# Patient Record
Sex: Male | Born: 1965
Health system: Southern US, Community
[De-identification: ages and names within clinical notes are randomized; demographics above are authoritative.]

## PROBLEM LIST (undated history)

## (undated) ENCOUNTER — Emergency Department (HOSPITAL_COMMUNITY): Discharge status: Absent without leave | Payer: Self-pay

## (undated) DIAGNOSIS — N529 Male erectile dysfunction, unspecified: Secondary | ICD-10-CM

## (undated) DIAGNOSIS — E7800 Pure hypercholesterolemia, unspecified: Secondary | ICD-10-CM

## (undated) DIAGNOSIS — I10 Essential (primary) hypertension: Secondary | ICD-10-CM

## (undated) HISTORY — DX: Male erectile dysfunction, unspecified: N52.9

## (undated) HISTORY — DX: Essential (primary) hypertension: I10

## (undated) HISTORY — DX: Pure hypercholesterolemia, unspecified: E78.00

## (undated) HISTORY — PX: ELBOW SURGERY: SHX618

---

## 2001-04-03 ENCOUNTER — Ambulatory Visit (HOSPITAL_COMMUNITY): Admission: RE | Admit: 2001-04-03 | Discharge: 2001-04-03 | Payer: Self-pay | Admitting: Internal Medicine

## 2001-04-03 ENCOUNTER — Encounter: Payer: Self-pay | Admitting: Internal Medicine

## 2001-05-14 ENCOUNTER — Ambulatory Visit (HOSPITAL_COMMUNITY): Admission: RE | Admit: 2001-05-14 | Discharge: 2001-05-14 | Payer: Self-pay | Admitting: Internal Medicine

## 2001-05-14 ENCOUNTER — Encounter: Payer: Self-pay | Admitting: Internal Medicine

## 2003-08-25 ENCOUNTER — Other Ambulatory Visit: Admission: RE | Admit: 2003-08-25 | Discharge: 2003-08-25 | Payer: Self-pay | Admitting: Dermatology

## 2004-07-05 ENCOUNTER — Emergency Department (HOSPITAL_COMMUNITY): Admission: EM | Admit: 2004-07-05 | Discharge: 2004-07-05 | Payer: Self-pay | Admitting: Emergency Medicine

## 2013-01-01 ENCOUNTER — Ambulatory Visit (HOSPITAL_COMMUNITY)
Admission: RE | Admit: 2013-01-01 | Discharge: 2013-01-01 | Disposition: A | Discharge status: Home / Self Care | Payer: BC Managed Care – PPO | Source: Ambulatory Visit | Attending: Family Medicine | Admitting: Family Medicine

## 2013-01-01 ENCOUNTER — Other Ambulatory Visit: Payer: Self-pay | Admitting: Family Medicine

## 2013-01-01 DIAGNOSIS — M79642 Pain in left hand: Secondary | ICD-10-CM

## 2013-01-01 DIAGNOSIS — M25549 Pain in joints of unspecified hand: Secondary | ICD-10-CM | POA: Insufficient documentation

## 2013-04-05 ENCOUNTER — Encounter: Payer: Self-pay | Admitting: Family Medicine

## 2013-04-05 ENCOUNTER — Ambulatory Visit (INDEPENDENT_AMBULATORY_CARE_PROVIDER_SITE_OTHER): Payer: BC Managed Care – PPO | Admitting: Family Medicine

## 2013-04-05 VITALS — BP 134/92 | Temp 98.8°F | Ht 70.0 in | Wt 192.2 lb

## 2013-04-05 DIAGNOSIS — W57XXXA Bitten or stung by nonvenomous insect and other nonvenomous arthropods, initial encounter: Secondary | ICD-10-CM

## 2013-04-05 DIAGNOSIS — T148 Other injury of unspecified body region: Secondary | ICD-10-CM

## 2013-04-05 DIAGNOSIS — Z008 Encounter for other general examination: Secondary | ICD-10-CM

## 2013-04-05 MED ORDER — DOXYCYCLINE HYCLATE 100 MG PO CAPS: 100.0000 mg | ORAL_CAPSULE | Freq: Two times a day (BID) | ORAL | Status: DC

## 2013-04-05 NOTE — Progress Notes (Signed)
  Subjective:    Patient ID: Rae Halsted, male    DOB: 09/05/1966, 47 y.o.   MRN: [id number]  HPI This patient had a tick bite several weeks ago and then [place]. He denies any rash denies high fever chills denies abdominal pain but he does relate that he had myalgias felt weak also sweaty felt nauseous no diarrhea no vomiting. No runny nose no bleeding problems.he relates his symptoms as pretty bad Saturday and Sunday with headache sweatiness feeling weak all over feeling a little bit better currently Past medical history benign Family history hypertension Patient does not smoke   Review of Systemssee above.     Objective:   Physical Exam TMs NL throat normal neck is supple lungs are clear heart is regular no rashes seen abdomen is soft       Assessment & Plan:  ppossible tick related illness-doxycycline under milligrams twice a day for the next 10 days in addition to this if high fevers severe rash increased headache vomiting or other problems occur immediately call back for recheck.

## 2013-04-10 LAB — LIPID PANEL
Cholesterol: 299 mg/dL — ABNORMAL HIGH (ref 0–200)
HDL: 45 mg/dL (ref >39)
Total CHOL/HDL Ratio: 6.6 Ratio
Triglycerides: 426 mg/dL — ABNORMAL HIGH (ref <150)

## 2013-04-10 LAB — GLUCOSE, RANDOM: Glucose, Bld: 93 mg/dL (ref 70–99)

## 2013-04-11 ENCOUNTER — Encounter: Payer: Self-pay | Admitting: Family Medicine

## 2013-04-13 ENCOUNTER — Encounter: Payer: Self-pay | Admitting: Family Medicine

## 2013-04-29 ENCOUNTER — Encounter: Payer: Self-pay | Admitting: *Deleted

## 2013-05-07 ENCOUNTER — Encounter: Payer: Self-pay | Admitting: Family Medicine

## 2013-05-07 ENCOUNTER — Ambulatory Visit (INDEPENDENT_AMBULATORY_CARE_PROVIDER_SITE_OTHER): Payer: BC Managed Care – PPO | Admitting: Family Medicine

## 2013-05-07 VITALS — BP 146/104 | Temp 98.7°F | Wt 189.2 lb

## 2013-05-07 DIAGNOSIS — I10 Essential (primary) hypertension: Secondary | ICD-10-CM

## 2013-05-07 DIAGNOSIS — R05 Cough: Secondary | ICD-10-CM

## 2013-05-07 DIAGNOSIS — E781 Pure hyperglyceridemia: Secondary | ICD-10-CM

## 2013-05-07 DIAGNOSIS — R059 Cough, unspecified: Secondary | ICD-10-CM

## 2013-05-07 DIAGNOSIS — E785 Hyperlipidemia, unspecified: Secondary | ICD-10-CM | POA: Insufficient documentation

## 2013-05-07 MED ORDER — AZITHROMYCIN 250 MG PO TABS: ORAL_TABLET | ORAL | Status: DC

## 2013-05-07 NOTE — Patient Instructions (Signed)
  Fish oil or flax seed twice a day Labs in three months Office visit in 3 months    DASH Diet The DASH diet stands for "Dietary Approaches to Stop Hypertension." It is a healthy eating plan that has been shown to reduce high blood pressure (hypertension) in as little as 14 days, while also possibly providing other significant health benefits. These other health benefits include reducing the risk of breast cancer after menopause and reducing the risk of type 2 diabetes, heart disease, colon cancer, and stroke. Health benefits also include weight loss and slowing kidney failure in patients with chronic kidney disease.  DIET GUIDELINES  Limit salt (sodium). Your diet should contain less than 1500 mg of sodium daily.  Limit refined or processed carbohydrates. Your diet should include mostly whole grains. Desserts and added sugars should be used sparingly.  Include small amounts of heart-healthy fats. These types of fats include nuts, oils, and tub margarine. Limit saturated and trans fats. These fats have been shown to be harmful in the body. CHOOSING FOODS  The following food groups are based on a 2000 calorie diet. See your Registered Dietitian for individual calorie needs. Grains and Grain Products (6 to 8 servings daily)  Eat More Often: Whole-wheat bread, brown rice, whole-grain or wheat pasta, quinoa, popcorn without added fat or salt (air popped).  Eat Less Often: White bread, white pasta, white rice, cornbread. Vegetables (4 to 5 servings daily)  Eat More Often: Fresh, frozen, and canned vegetables. Vegetables may be raw, steamed, roasted, or grilled with a minimal amount of fat.  Eat Less Often/Avoid: Creamed or fried vegetables. Vegetables in a cheese sauce. Fruit (4 to 5 servings daily)  Eat More Often: All fresh, canned (in natural juice), or frozen fruits. Dried fruits without added sugar. One hundred percent fruit juice ( cup [237 mL] daily).  Eat Less Often: Dried fruits  with added sugar. Canned fruit in light or heavy syrup. [employer], Fish, and Poultry (2 servings or less daily. One serving is 3 to 4 oz [85-114 g]).  Eat More Often: Ninety percent or leaner ground beef, tenderloin, sirloin. Round cuts of beef, chicken breast, [place] breast. All fish. Grill, bake, or broil your meat. Nothing should be fried.  Eat Less Often/Avoid: Fatty cuts of meat, [place], or chicken leg, thigh, or wing. Fried cuts of meat or fish. Dairy (2 to 3 servings)  Eat More Often: Low-fat or fat-free milk, low-fat plain or light yogurt, reduced-fat or part-skim cheese.  Eat Less Often/Avoid: Milk (whole, 2%).Whole milk yogurt. Full-fat cheeses. Nuts, Seeds, and Legumes (4 to 5 servings per week)  Eat More Often: All without added salt.  Eat Less Often/Avoid: Salted nuts and seeds, canned beans with added salt. Fats and Sweets (limited)  Eat More Often: Vegetable oils, tub margarines without trans fats, sugar-free gelatin. Mayonnaise and salad dressings.  Eat Less Often/Avoid: Coconut oils, palm oils, butter, stick margarine, cream, half and half, cookies, candy, pie. FOR MORE INFORMATION The Dash Diet Eating Plan: www.dashdiet.org Document Released: 11/07/2011 Document Revised: 02/10/2012 Document Reviewed: 11/07/2011 [facility] Patient Information 2014 [place], [place].

## 2013-05-07 NOTE — Progress Notes (Signed)
  Subjective:    Patient ID: Scott Villarreal, male    DOB: 03/07/1966, 47 y.o.   MRN: [id number]  Cough This is a new problem. The current episode started in the past 7 days. The problem has been gradually worsening. The cough is productive of sputum. Associated symptoms include ear pain and a sore throat. He has tried OTC cough suppressant for the symptoms. The treatment provided mild relief.      Review of Systems  HENT: Positive for ear pain and sore throat.   Respiratory: Positive for cough.        Objective:   Physical Exam Lungs are clear hearts regular pulse normal blood pressure is moderately elevated. Head congestion noted mild sinus tenderness eardrums normal throat normal.       Assessment & Plan:  Hypertension-patient encouraged to watch diet, check blood pressure with a home monitor, bring home monitor in, followup again within 3 months time check lab work at that time. If blood pressure still up will need additional him treatments  Elevated triglycerides-patient does not abuse alcohol but he does need to reduce the amount of alcohol he is using. Minimize starches sugars. Recheck lipid profile in approximately 3 months. Flaxseed or fish oil capsules recommended.  Acute sinusitis-Zithromax as directed. Allergy tablet as needed. Followup if problems.

## 2013-05-21 ENCOUNTER — Ambulatory Visit (INDEPENDENT_AMBULATORY_CARE_PROVIDER_SITE_OTHER): Payer: BC Managed Care – PPO | Admitting: Nurse Practitioner

## 2013-05-21 ENCOUNTER — Encounter: Payer: Self-pay | Admitting: Nurse Practitioner

## 2013-05-21 VITALS — BP 138/86 | Temp 98.1°F | Wt 193.0 lb

## 2013-05-21 DIAGNOSIS — J320 Chronic maxillary sinusitis: Secondary | ICD-10-CM

## 2013-05-21 MED ORDER — AMOXICILLIN-POT CLAVULANATE 875-125 MG PO TABS: 1.0000 | ORAL_TABLET | Freq: Two times a day (BID) | ORAL | Status: DC

## 2013-05-21 MED ORDER — METHYLPREDNISOLONE ACETATE 40 MG/ML IJ SUSP
40.0000 mg | Freq: Once | INTRAMUSCULAR | Status: AC
  Administered 2013-05-21: 40 mg via INTRAMUSCULAR

## 2013-05-21 NOTE — Patient Instructions (Signed)
Antihistamine such as Claritin, Zyrtec or Allegra Nasacort AQ as directed

## 2013-05-24 NOTE — Progress Notes (Signed)
Subjective:  Presents with complaints of pain in the right anterior cervical area that began 3 days ago. No fever. Feeling of fluid in his right ear. Rare cough. No sore throat. Head congestion. Maxillary area sinus pressure more so on the right side. Occasional headache. No ear pain. No wheezing. Just completed a Z-Pak, pressure continues.  Objective:   BP 138/86  Temp(Src) 98.1 F (36.7 C) (Oral)  Wt 193 lb (87.544 kg)  BMI 27.69 kg/m2 NAD. Alert, oriented. TMs significant clear effusion, no erythema. Pharynx injected with PND noted. Neck supple with mild soft adenopathy, tender in the right anterior cervical area. Lungs clear. Heart regular rate rhythm.  Assessment:Maxillary sinusitis - Plan: methylPREDNISolone acetate (DEPO-MEDROL) injection 40 mg  Plan: Meds ordered this encounter  Medications  . DISCONTD: doxycycline (VIBRA-TABS) 100 MG tablet    Sig:   . amoxicillin-clavulanate (AUGMENTIN) 875-125 MG per tablet    Sig: Take 1 tablet by mouth 2 (two) times daily.    Dispense:  20 tablet    Refill:  0    Order Specific Question:  Supervising Provider    Answer:  Merlyn Albert [[id number]]  . methylPREDNISolone acetate (DEPO-MEDROL) injection 40 mg    Sig:    OTC meds as directed including antihistamines and Nasacort AQ. Call back in 7-10 days if no improvement, sooner if worse.

## 2013-08-17 ENCOUNTER — Ambulatory Visit (INDEPENDENT_AMBULATORY_CARE_PROVIDER_SITE_OTHER): Payer: BC Managed Care – PPO | Admitting: Family Medicine

## 2013-08-17 ENCOUNTER — Encounter: Payer: Self-pay | Admitting: Family Medicine

## 2013-08-17 VITALS — BP 164/114 | Ht 69.0 in | Wt 187.0 lb

## 2013-08-17 DIAGNOSIS — I10 Essential (primary) hypertension: Secondary | ICD-10-CM

## 2013-08-17 DIAGNOSIS — M25519 Pain in unspecified shoulder: Secondary | ICD-10-CM

## 2013-08-17 DIAGNOSIS — M25511 Pain in right shoulder: Secondary | ICD-10-CM

## 2013-08-17 MED ORDER — POTASSIUM CHLORIDE ER 10 MEQ PO TBCR: 10.0000 meq | EXTENDED_RELEASE_TABLET | Freq: Every day | ORAL | Status: DC

## 2013-08-17 MED ORDER — HYDROCHLOROTHIAZIDE 25 MG PO TABS: 25.0000 mg | ORAL_TABLET | Freq: Every day | ORAL | Status: DC

## 2013-08-17 MED ORDER — MELOXICAM 15 MG PO TABS: 15.0000 mg | ORAL_TABLET | Freq: Every day | ORAL | Status: DC

## 2013-08-17 NOTE — Progress Notes (Signed)
  Subjective:    Patient ID: Scott Villarreal, male    DOB: November 05, 1966, 47 y.o.   MRN: [id number]  HPIHere for right shoulder pain for about 1 month. Right arm goes numb sometimes. Tried aleve. It helps some.  Lucendia Herrlich states pain hurts with certain movements tried the leads and ibuprofen without help states pain radiates down the right arm. Hurts with certain movements. Does not start in the neck it is more in the shoulder and also hurts when he sleeps on it. No known injury no prior trouble with this.  Has family history hypertension. Review of Systems Patient denies chest tightness headache shortness of breath.    Objective:   Physical Exam  Blood pressure elevated even on recheck best reading 148/94 lungs clear heart regular pulse normal moderate shoulder tenderness fair range of motion no obvious on rotator cuff more likely a.c. joint      Assessment & Plan:  Shoulder pain-Mobic, referral to orthopedics may need injections possibly x-ray HTN start hydrochlorothiazide one each morning along with potassium followup in 3 months recheck blood pressure.

## 2013-10-07 ENCOUNTER — Other Ambulatory Visit: Payer: Self-pay

## 2014-03-01 ENCOUNTER — Encounter: Payer: Self-pay | Admitting: Family Medicine

## 2014-03-01 ENCOUNTER — Ambulatory Visit (INDEPENDENT_AMBULATORY_CARE_PROVIDER_SITE_OTHER): Payer: BC Managed Care – PPO | Admitting: Family Medicine

## 2014-03-01 VITALS — BP 132/98 | Temp 98.3°F | Ht 70.0 in | Wt 194.0 lb

## 2014-03-01 DIAGNOSIS — J329 Chronic sinusitis, unspecified: Secondary | ICD-10-CM

## 2014-03-01 MED ORDER — AMOXICILLIN-POT CLAVULANATE 875-125 MG PO TABS: 1.0000 | ORAL_TABLET | Freq: Two times a day (BID) | ORAL | Status: DC

## 2014-03-01 MED ORDER — HYDROCHLOROTHIAZIDE 25 MG PO TABS: 25.0000 mg | ORAL_TABLET | Freq: Every day | ORAL | Status: DC

## 2014-03-01 MED ORDER — POTASSIUM CHLORIDE ER 10 MEQ PO TBCR: 10.0000 meq | EXTENDED_RELEASE_TABLET | Freq: Every day | ORAL | Status: DC

## 2014-03-01 NOTE — Progress Notes (Signed)
   Subjective:    Patient ID: Scott Villarreal, male    DOB: [birth year], 48 y.o.   MRN: [id number]  Headache  This is a new problem. The current episode started in the past 7 days. Associated symptoms include coughing. Associated symptoms comments: Runny nose, swollen glands. He has tried acetaminophen for the symptoms.   Requesting refill on blood pressure med. Last BP check up was June 2014.   Taking bp meds faithfully renewed Recently ran out of blood pressure medicine. Trying to watch salt intake. No longer smoking. Left lat neck pain no long  Mild sweat maybe fever    Review of Systems  Respiratory: Positive for cough.   Neurological: Positive for headaches.       Objective:   Physical Exam Alert mild malaise. H&T moderate his congestion frontal tenderness left anterior cervical adenitis neck supple lungs clear. Heart regular rate rhythm. Blood pressure 132 or 96 on repeat.       Assessment & Plan:  Impression 1 hypertension suboptimal in with recent noncompliance. #2 rhinosinusitis with cervical lymphadenitis plan Augmentin twice a day 10 days. Blood pressure medications resume. Encouraged to followup with Dr. Lorin PicketScott for a wellness exam. WSL

## 2014-05-12 ENCOUNTER — Encounter: Payer: Self-pay | Admitting: Family Medicine

## 2014-05-12 ENCOUNTER — Ambulatory Visit (HOSPITAL_COMMUNITY)
Admission: RE | Admit: 2014-05-12 | Discharge: 2014-05-12 | Disposition: A | Discharge status: Home / Self Care | Payer: BC Managed Care – PPO | Source: Ambulatory Visit | Attending: Family Medicine | Admitting: Family Medicine

## 2014-05-12 ENCOUNTER — Ambulatory Visit (INDEPENDENT_AMBULATORY_CARE_PROVIDER_SITE_OTHER): Payer: BC Managed Care – PPO | Admitting: Family Medicine

## 2014-05-12 VITALS — BP 150/90 | Temp 98.6°F | Ht 70.0 in | Wt 186.1 lb

## 2014-05-12 DIAGNOSIS — R1032 Left lower quadrant pain: Secondary | ICD-10-CM

## 2014-05-12 DIAGNOSIS — R933 Abnormal findings on diagnostic imaging of other parts of digestive tract: Secondary | ICD-10-CM | POA: Insufficient documentation

## 2014-05-12 LAB — CBC WITH DIFFERENTIAL/PLATELET
BASOS PCT: 0 % (ref 0–1)
Basophils Absolute: 0 10*3/uL (ref 0.0–0.1)
EOS ABS: 0.1 10*3/uL (ref 0.0–0.7)
EOS PCT: 1 % (ref 0–5)
HCT: 46.2 % (ref 39.0–52.0)
Hemoglobin: 16 g/dL (ref 13.0–17.0)
Lymphocytes Relative: 35 % (ref 12–46)
Lymphs Abs: 2.5 10*3/uL (ref 0.7–4.0)
MCH: 30.5 pg (ref 26.0–34.0)
MCHC: 34.6 g/dL (ref 30.0–36.0)
MCV: 88 fL (ref 78.0–100.0)
MONOS PCT: 8 % (ref 3–12)
Monocytes Absolute: 0.6 10*3/uL (ref 0.1–1.0)
NEUTROS PCT: 56 % (ref 43–77)
Neutro Abs: 4 10*3/uL (ref 1.7–7.7)
Platelets: 247 10*3/uL (ref 150–400)
RBC: 5.25 MIL/uL (ref 4.22–5.81)
RDW: 12.6 % (ref 11.5–15.5)
WBC: 7.1 10*3/uL (ref 4.0–10.5)

## 2014-05-12 LAB — LIPASE: LIPASE: 38 U/L (ref 0–75)

## 2014-05-12 LAB — HEPATIC FUNCTION PANEL
ALK PHOS: 56 U/L (ref 39–117)
ALT: 37 U/L (ref 0–53)
AST: 23 U/L (ref 0–37)
Albumin: 3.9 g/dL (ref 3.5–5.2)
Bilirubin, Direct: <0.1 mg/dL (ref 0.0–0.3)
TOTAL PROTEIN: 7.1 g/dL (ref 6.0–8.3)
Total Bilirubin: 0.4 mg/dL (ref 0.2–1.2)

## 2014-05-12 LAB — BASIC METABOLIC PANEL
BUN: 14 mg/dL (ref 6–23)
CHLORIDE: 98 meq/L (ref 96–112)
CO2: 30 mEq/L (ref 19–32)
Calcium: 9.4 mg/dL (ref 8.4–10.5)
Creat: 1.1 mg/dL (ref 0.50–1.35)
Glucose, Bld: 95 mg/dL (ref 70–99)
POTASSIUM: 4.2 meq/L (ref 3.5–5.3)
Sodium: 141 mEq/L (ref 135–145)

## 2014-05-12 MED ORDER — METRONIDAZOLE 500 MG PO TABS: 500.0000 mg | ORAL_TABLET | Freq: Three times a day (TID) | ORAL | Status: DC

## 2014-05-12 MED ORDER — IOHEXOL 300 MG/ML  SOLN
100.0000 mL | Freq: Once | INTRAMUSCULAR | Status: AC | PRN
  Administered 2014-05-12: 100 mL via INTRAVENOUS

## 2014-05-12 MED ORDER — CIPROFLOXACIN HCL 500 MG PO TABS: 500.0000 mg | ORAL_TABLET | Freq: Two times a day (BID) | ORAL | Status: AC

## 2014-05-12 NOTE — Progress Notes (Signed)
   Subjective:    Patient ID: Scott Villarreal, male    DOB: [birth year], 48 y.o.   MRN: [id number]  Abdominal Pain This is a new problem. The current episode started 1 to 4 weeks ago. The onset quality is sudden. The problem occurs intermittently. The problem has been unchanged. The pain is located in the LLQ. The pain is at a severity of 7/10. The pain is moderate. Associated symptoms include diarrhea and flatus. Nothing aggravates the pain. The pain is relieved by nothing. He has tried nothing for the symptoms. The treatment provided no relief.   Patient states he has no other concerns at this time.  Patient does admit to alcohol use does not smoke currently. Denies blood in the stool. Has not had any previous surgeries of the abdomen and no family history of colon cancer  Review of Systems  Gastrointestinal: Positive for abdominal pain, diarrhea and flatus.   he denies blood in the stools he does states severe constipation over the past week and significant left lower quadrant pain and discomfort that's been present for a week not getting better but getting worse.     Objective:   Physical Exam  Lungs clear hearts regular pulse normal abdomen soft with guarding and tenderness in the left lower quadrant extremities no edema skin warm dry      Assessment & Plan:  This gentleman is having left lower quadrant pain he needs to testing for blood work and for x-ray/CAT scan to rule out the possibility of an abscess diverticulitis hopefully there is no sign of any type of colon cancer certainly may need GI referral with colonoscopy depending on the results of all these tests he is due for colonoscopy and he weighs in a few months when he is 48 years old. Antibiotics also ordered Cipro and Flagyl.  Patient's CAT scan showed diverticulitis antibiotics are to be used if not improving over the next few days to followup in addition to this recheck in 2 weeks to make sure this area is totally well.

## 2014-08-26 ENCOUNTER — Ambulatory Visit (INDEPENDENT_AMBULATORY_CARE_PROVIDER_SITE_OTHER): Payer: BC Managed Care – PPO | Admitting: Nurse Practitioner

## 2014-08-26 ENCOUNTER — Other Ambulatory Visit: Payer: Self-pay | Admitting: Family Medicine

## 2014-08-26 VITALS — BP 140/88 | Ht 70.0 in | Wt 189.0 lb

## 2014-08-26 DIAGNOSIS — M25562 Pain in left knee: Secondary | ICD-10-CM

## 2014-08-26 DIAGNOSIS — M25569 Pain in unspecified knee: Secondary | ICD-10-CM

## 2014-08-26 MED ORDER — HYDROCHLOROTHIAZIDE 25 MG PO TABS: 25.0000 mg | ORAL_TABLET | Freq: Every day | ORAL | Status: DC

## 2014-08-26 MED ORDER — MELOXICAM 15 MG PO TABS: 15.0000 mg | ORAL_TABLET | Freq: Every day | ORAL | Status: DC

## 2014-08-29 ENCOUNTER — Encounter: Payer: Self-pay | Admitting: Nurse Practitioner

## 2014-08-29 NOTE — Progress Notes (Signed)
Subjective:  Presents for complaints of persistent left knee pain over the past several months. No specific history of injury. 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 Various parts of the knee have her at times, now having discomfort towards the lateral posterior knee. Minimal edema. Better when he is off his knee for while. Has been working overtime.  Objective:   BP 140/88  Ht  (1.778 m)  Wt 189 lb (85.73 kg)  BMI 27.12 kg/m2 NAD. Alert, oriented. Lungs clear. Heart regular rate rhythm. Left knee no obvious edema. No joint laxity. Minimal crepitus. No popliteal masses, a few small mobile rubbery nodules noted. Mild tenderness to palpation. Gait normal.  Assessment: Left knee pain most likely secondary to repetitive motion  Plan:  Meds ordered this encounter  Medications  . meloxicam (MOBIC) 15 MG tablet    Sig: Take 1 tablet (15 mg total) by mouth daily.    Dispense:  30 tablet    Refill:  2    Order Specific Question:  Supervising Provider    Answer:  Merlyn Albert [[id number]]  . hydrochlorothiazide (HYDRODIURIL) 25 MG tablet    Sig: Take 1 tablet (25 mg total) by mouth daily.    Dispense:  30 tablet    Refill:  0   Given information on orthopedic specialist in [place]. Patient to schedule his own appointment for evaluation and treatment. To contact our office if we can be of assistance.

## 2014-09-30 ENCOUNTER — Other Ambulatory Visit: Payer: Self-pay | Admitting: Family Medicine

## 2014-10-03 ENCOUNTER — Telehealth: Payer: Self-pay | Admitting: Nurse Practitioner

## 2014-10-03 NOTE — Telephone Encounter (Signed)
Scott Villarreal, can you please send info on [facility] in [place]? Thanks. [name]

## 2014-10-03 NOTE — Telephone Encounter (Signed)
Patient seen for knee pain on 08/26/14 by [name]. Wants to know if we can give him information for ortho in [place] she was going to send him to?

## 2014-10-06 NOTE — Telephone Encounter (Signed)
[facility] [phone])

## 2014-10-15 ENCOUNTER — Emergency Department (HOSPITAL_COMMUNITY): Payer: BC Managed Care – PPO

## 2014-10-15 ENCOUNTER — Emergency Department (HOSPITAL_COMMUNITY)
Admission: EM | Admit: 2014-10-15 | Discharge: 2014-10-15 | Disposition: A | Discharge status: Home / Self Care | Payer: BC Managed Care – PPO | Attending: Emergency Medicine | Admitting: Emergency Medicine

## 2014-10-15 ENCOUNTER — Encounter (HOSPITAL_COMMUNITY): Payer: Self-pay | Admitting: Emergency Medicine

## 2014-10-15 DIAGNOSIS — Z87891 Personal history of nicotine dependence: Secondary | ICD-10-CM | POA: Insufficient documentation

## 2014-10-15 DIAGNOSIS — M25562 Pain in left knee: Secondary | ICD-10-CM | POA: Insufficient documentation

## 2014-10-15 DIAGNOSIS — I10 Essential (primary) hypertension: Secondary | ICD-10-CM | POA: Diagnosis not present

## 2014-10-15 DIAGNOSIS — Z791 Long term (current) use of non-steroidal anti-inflammatories (NSAID): Secondary | ICD-10-CM | POA: Insufficient documentation

## 2014-10-15 DIAGNOSIS — M79605 Pain in left leg: Secondary | ICD-10-CM

## 2014-10-15 DIAGNOSIS — M7122 Synovial cyst of popliteal space [Baker], left knee: Secondary | ICD-10-CM | POA: Insufficient documentation

## 2014-10-15 MED ORDER — TRAMADOL HCL 50 MG PO TABS: 50.0000 mg | ORAL_TABLET | Freq: Four times a day (QID) | ORAL | Status: DC | PRN

## 2014-10-15 MED ORDER — OXYCODONE-ACETAMINOPHEN 5-325 MG PO TABS
1.0000 | ORAL_TABLET | Freq: Once | ORAL | Status: AC
  Administered 2014-10-15: 1 via ORAL
  Filled 2014-10-15: qty 1

## 2014-10-15 NOTE — ED Provider Notes (Signed)
CSN: [id number]     Arrival date & time 10/15/14  0756 History   First MD Initiated Contact with Patient 10/15/14 [phone]     Chief Complaint  Patient presents with  . Knee Pain     (Consider location/radiation/quality/duration/timing/severity/associated sxs/prior Treatment) Patient is a 48 y.o. male presenting with knee pain. The history is provided by the patient.  Knee Pain Location:  Knee Time since incident:  4 months Injury: no   Knee location:  L knee Pain details:    Quality:  Shooting and burning   Radiates to:  L leg   Severity:  Severe   Onset quality:  Gradual   Duration:  4 months   Timing:  Constant   Progression:  Worsening Dislocation: no   Foreign body present:  No foreign bodies Prior injury to area:  No Relieved by:  Nothing Ineffective treatments: cortisone injection.  Evonnie Patdward Lee Lezcano is a 48 y.o. male who presents to the ED with left knee pain that started 4 months ago. He states that he has had x-ray and they injected his knee 5 days ago with cortisone. It decreased the pain but now the pain has returned. The pain is behind the knee and radiates into the calf. The pain increases with movement. PMH significant for HTN.   Past Medical History  Diagnosis Date  . Hypertension    Past Surgical History  Procedure Laterality Date  . Elbow surgery Left    History reviewed. No pertinent family history. History  Substance Use Topics  . Smoking status: Former Smoker -- 2.00 packs/day for 25 years    Types: Cigarettes    Quit date: 08/17/1998  . Smokeless tobacco: Never Used  . Alcohol Use: 18.6 oz/week    24 Cans of beer, 7 Shots of liquor per week    Review of Systems Negative except as stated in HPI   Allergies  Review of patient's allergies indicates no known allergies.  Home Medications   Prior to Admission medications   Medication Sig Start Date End Date Taking? Authorizing Provider  hydrochlorothiazide (HYDRODIURIL) 25 MG tablet TAKE ONE (1)  TABLET BY MOUTH EVERY DAY 09/30/14   Babs SciaraScott A Luking, MD  meloxicam (MOBIC) 15 MG tablet Take 1 tablet (15 mg total) by mouth daily. 08/26/14   Campbell Richesarolyn C Hoskins, NP  potassium chloride (K-DUR) 10 MEQ tablet TAKE ONE (1) TABLET BY MOUTH EVERY DAY 08/26/14   Merlyn AlbertWilliam S Luking, MD   BP 148/100 mmHg  Pulse 100  Temp(Src) 97.8 F (36.6 C) (Oral)  Resp 16  Ht 5\' 10"  (1.778 m)  Wt 185 lb (83.915 kg)  BMI 26.54 kg/m2  SpO2 97% Physical Exam  Constitutional: He is oriented to person, place, and time. He appears well-developed and well-nourished.  HENT:  Head: Normocephalic.  Eyes: EOM are normal.  Neck: Neck supple.  Cardiovascular: Normal rate.   Pulmonary/Chest: Effort normal.  Abdominal: Soft. There is no tenderness.  Musculoskeletal: Normal range of motion.       Left knee: He exhibits normal range of motion, no swelling, no ecchymosis, no deformity, no laceration, no erythema, normal alignment and normal patellar mobility. Tenderness found.       Legs: Pedal pulses equal, adequate circulation, good touch sensation. Straight leg raises without difficulty or pain. Pain with palpation and weight bearing posterior aspect of the left knee.   Neurological: He is alert and oriented to person, place, and time. No cranial nerve deficit.  Skin: Skin is warm and  dry.  Psychiatric: He has a normal mood and affect. His behavior is normal.  Nursing note and vitals reviewed.   ED Course  Procedures [place] Venous Img Lower Unilateral Left  10/15/2014   CLINICAL DATA:  Left leg pain  EXAM: Left LOWER EXTREMITY VENOUS DOPPLER ULTRASOUND  TECHNIQUE: Gray-scale sonography with graded compression, as well as color Doppler and duplex ultrasound were performed to evaluate the lower extremity deep venous systems from the level of the common femoral vein and including the common femoral, femoral, profunda femoral, popliteal and calf veins including the posterior tibial, peroneal and gastrocnemius veins when visible.  The superficial great saphenous vein was also interrogated. Spectral Doppler was utilized to evaluate flow at rest and with distal augmentation maneuvers in the common femoral, femoral and popliteal veins.  COMPARISON:  None.  FINDINGS: Contralateral Common Femoral Vein: Respiratory phasicity is normal and symmetric with the symptomatic side. No evidence of thrombus. Normal compressibility.  Common Femoral Vein: No evidence of thrombus. Normal compressibility, respiratory phasicity and response to augmentation.  Saphenofemoral Junction: No evidence of thrombus. Normal compressibility and flow on color Doppler imaging.  Profunda Femoral Vein: No evidence of thrombus. Normal compressibility and flow on color Doppler imaging.  Femoral Vein: No evidence of thrombus. Normal compressibility, respiratory phasicity and response to augmentation.  Popliteal Vein: No evidence of thrombus. Normal compressibility, respiratory phasicity and response to augmentation.  Calf Veins: No evidence of thrombus. Normal compressibility and flow on color Doppler imaging.  Superficial Great Saphenous Vein: No evidence of thrombus. Normal compressibility and flow on color Doppler imaging.  Venous Reflux:  None.  Other Findings: Left popliteal cyst is noted. It measures 1.9 cm in greatest dimension.  IMPRESSION: Left popliteal cyst.  No evidence of deep venous thrombosis.   Electronically Signed   By: Alcide CleverMark  Lukens M.D.   On: 10/15/2014 09:49    MDM  48 y.o. male with left posterior knee pain. Stable for discharge without DVT. Discussed clinical and x-ray findings and need for follow up with his PCP. He voices understanding and agrees with plan. Will treat for pain.    Medication List    TAKE these medications        acetaminophen 500 MG tablet  Commonly known as:  TYLENOL  Take 1,000 mg by mouth every 6 (six) hours as needed for moderate pain.     hydrochlorothiazide 25 MG tablet  Commonly known as:  HYDRODIURIL  Take 25 mg by  mouth daily.     meloxicam 15 MG tablet  Commonly known as:  MOBIC  Take 1 tablet (15 mg total) by mouth daily.     potassium chloride 10 MEQ tablet  Commonly known as:  K-DUR  Take 10 mEq by mouth daily.     traMADol 50 MG tablet  Commonly known as:  ULTRAM  Take 1 tablet (50 mg total) by mouth every 6 (six) hours as needed.      ASK your doctor about these medications        pseudoephedrine 30 MG tablet  Commonly known as:  SUDAFED  Take 30 mg by mouth every 4 (four) hours as needed for congestion.          [facility] Orlene OchM Neese, NP 10/15/14 1743  Benny LennertJoseph L Zammit, MD 10/16/14 [phone]

## 2014-10-15 NOTE — ED Notes (Signed)
Patient c/o left knee pain x4 months. Per patient had x-ray and cortisone injection on Monday. Helped a little but now pain is back and now worse then before. Patient reports pain is behind knee and radiates into calf. Patient does report increase pain with flexion of foot. Denies foot being cold.

## 2014-10-15 NOTE — ED Notes (Signed)
Patient with no complaints at this time. Respirations even and unlabored. Skin warm/dry. Discharge instructions reviewed with patient at this time. Patient given opportunity to voice concerns/ask questions. Patient discharged at this time and left Emergency Department with steady gait.   

## 2014-10-15 NOTE — Discharge Instructions (Signed)
Baker Cyst °A Baker cyst is a sac-like structure that forms in the back of the knee. It is filled with the same fluid that is located in your knee. This fluid lubricates the bones and cartilage of the knee and allows them to move over each other more easily. °CAUSES  °When the knee becomes injured or inflamed, increased fluid forms in the knee. When this happens, the joint lining is pushed out behind the knee and forms the Baker cyst. This cyst may also be caused by inflammation from arthritic conditions and infections. °SIGNS AND SYMPTOMS  °A Baker cyst usually has no symptoms. When the cyst is substantially enlarged: °· You may feel pressure behind the knee, stiffness in the knee, or a mass in the area behind the knee. °· You may develop pain, redness, and swelling in the calf.  This can suggest a blood clot and requires evaluation by your health care provider. °DIAGNOSIS  °A Baker cyst is most often found during an ultrasound exam. This exam may have been performed for other reasons, and the cyst was found incidentally. Sometimes an MRI is used. This picks up other problems within a joint that an ultrasound exam may not. If the Baker cyst developed immediately after an injury, X-ray exams may be used to diagnose the cyst. °TREATMENT  °The treatment depends on the cause of the cyst. Anti-inflammatory medicines and rest often will be prescribed. If the cyst is caused by a bacterial infection, antibiotic medicines may be prescribed.  °HOME CARE INSTRUCTIONS  °· If the cyst was caused by an injury, for the first 24 hours, keep the injured leg elevated on 2 pillows while lying down. °· For the first 24 hours while you are awake, apply ice to the injured area: °¨ Put ice in a plastic bag. °¨ Place a towel between your skin and the bag. °¨ Leave the ice on for 20 minutes, 2-3 times a day. °· Only take over-the-counter or prescription medicines for pain, discomfort, or fever as directed by your health care  provider. °· Only take antibiotic medicine as directed. Make sure to finish it even if you start to feel better. °MAKE SURE YOU:  °· Understand these instructions. °· Will watch your condition. °· Will get help right away if you are not doing well or get worse. °Document Released: 11/18/2005 Document Revised: 09/08/2013 Document Reviewed: 06/30/2013 °[employer]® Patient Information ©2015 [employer]. This information is not intended to replace advice given to you by your health care provider. Make sure you discuss any questions you have with your health care provider. ° °

## 2014-10-17 ENCOUNTER — Ambulatory Visit (INDEPENDENT_AMBULATORY_CARE_PROVIDER_SITE_OTHER): Payer: BC Managed Care – PPO | Admitting: Family Medicine

## 2014-10-17 ENCOUNTER — Encounter: Payer: Self-pay | Admitting: Family Medicine

## 2014-10-17 VITALS — BP 144/92 | Ht 70.0 in | Wt 190.0 lb

## 2014-10-17 DIAGNOSIS — I10 Essential (primary) hypertension: Secondary | ICD-10-CM

## 2014-10-17 DIAGNOSIS — M7122 Synovial cyst of popliteal space [Baker], left knee: Secondary | ICD-10-CM

## 2014-10-17 DIAGNOSIS — Z23 Encounter for immunization: Secondary | ICD-10-CM

## 2014-10-17 MED ORDER — LISINOPRIL 5 MG PO TABS: 5.0000 mg | ORAL_TABLET | Freq: Every day | ORAL | Status: DC

## 2014-10-17 NOTE — Progress Notes (Signed)
   Subjective:    Patient ID: Scott Villarreal, male    DOB: 12/04/1965, 48 y.o.   MRN: [id number]  HPI Patient went to ER on Saturday morning for left knee pain. They diagnosed him with a Baker's Cyst. They gave him pain meds and said to f/u with his PCP.  He went to a doctor's office in [place] to get a Cortisone shot last Monday. The pain has been getting worst.  Denies chest tightness pressure pain shortness breath  Review of Systems Knee pain discomfort    Objective:   Physical Exam Blood pressure elevated on 2 readings. Lungs clear heart regular popliteal cysts noted in the left knee region       Assessment & Plan:  Baker's cyst left leg referral to orthopedics we'll try to get appointment as quickly as possible.  Elevated blood pressure-add lisinopril. Follow-up 4-6 weeks to recheck blood pressure

## 2014-11-04 ENCOUNTER — Encounter: Payer: Self-pay | Admitting: Family Medicine

## 2014-12-22 ENCOUNTER — Encounter: Payer: Self-pay | Admitting: Family Medicine

## 2014-12-22 ENCOUNTER — Ambulatory Visit (INDEPENDENT_AMBULATORY_CARE_PROVIDER_SITE_OTHER): Payer: BLUE CROSS/BLUE SHIELD | Admitting: Family Medicine

## 2014-12-22 VITALS — BP 140/88 | Ht 70.0 in | Wt 192.2 lb

## 2014-12-22 DIAGNOSIS — E785 Hyperlipidemia, unspecified: Secondary | ICD-10-CM

## 2014-12-22 DIAGNOSIS — Z79899 Other long term (current) drug therapy: Secondary | ICD-10-CM

## 2014-12-22 DIAGNOSIS — I10 Essential (primary) hypertension: Secondary | ICD-10-CM

## 2014-12-22 NOTE — Progress Notes (Signed)
   Subjective:    Patient ID: Scott Villarreal, male    DOB: 02/02/1966, 49 y.o.   MRN: [id number]  HPI  Patient arrives for clearance for laparoscopic knee surgery. Patient states it is a 30 min procedure to help with knee pain. Patient states he overall is been feeling good taking his medication as directed he states he could be doing a better job watching his diet denying any problem with his blood pressure medicine Review of Systems Patient denies chest tightness pressure pain shortness of breath vomiting diarrhea swelling in the legs    Objective:   Physical Exam Extremities no edema blood pressure checked twice and is good large cuff used because patient has significant sized to the bicep. Abdomen soft lungs are clear heart regular no murmurs       Assessment & Plan:  HTN good control continue current measures. Watch diet closely. Exercise on a regular basis.  Left knee pain going to have orthopedic arthroscopic surgery. I believe that this would be perfectly fine. Patient does not need any cardiac clearance from cardiology. Patient was instructed to follow postoperative instructions to minimize risk of DVT

## 2015-03-27 IMAGING — US US EXTREM LOW VENOUS*L*
1 series · 13 of 24 positions shown · non-contrast
Comparison: None.

CLINICAL DATA: Left leg pain



[Series 1: us extrem low venous*left* · 0.08mm/px · 13 of 41 slices shown]
[im 1/41]
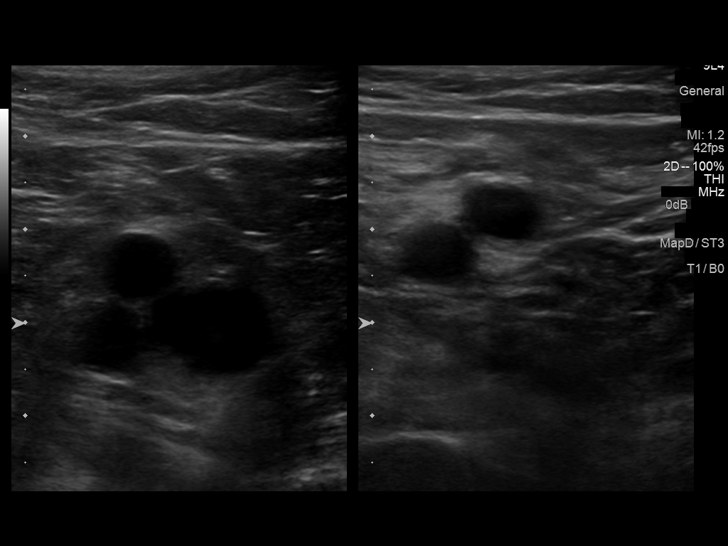
[im 4/41]
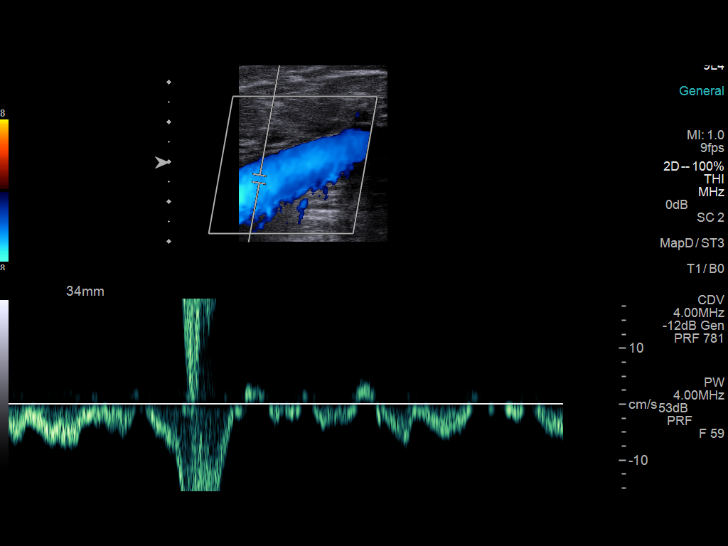
[im 7/41]
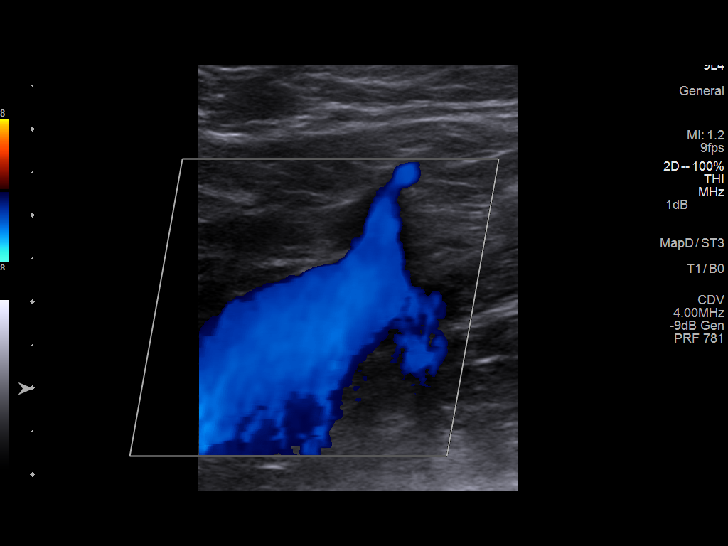
[im 11/41]
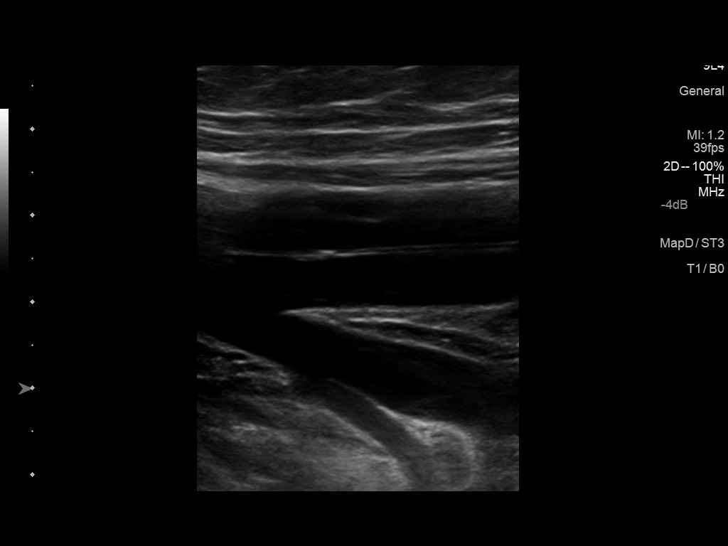
[im 14/41]
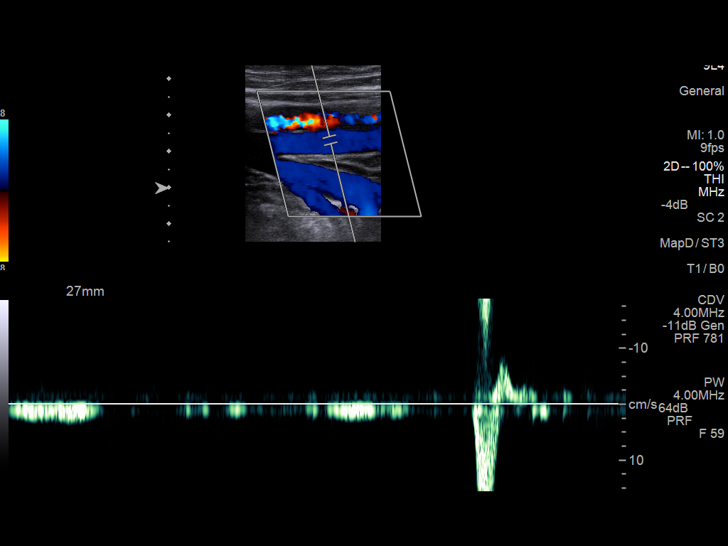
[im 18/41]
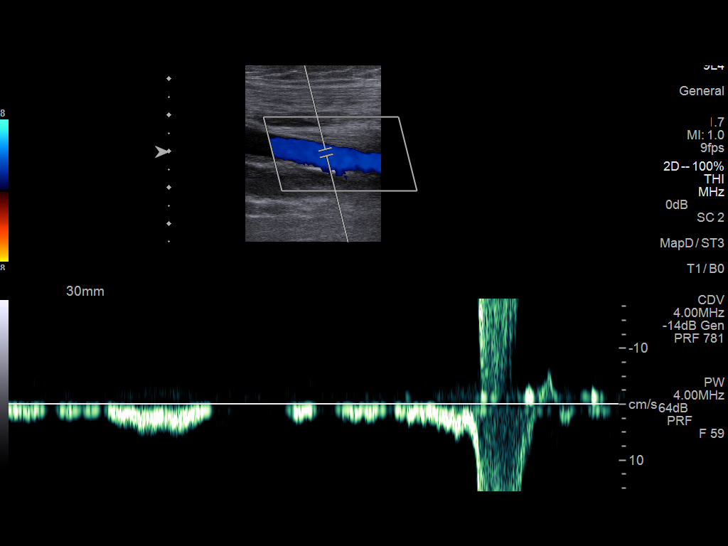
[im 21/41]
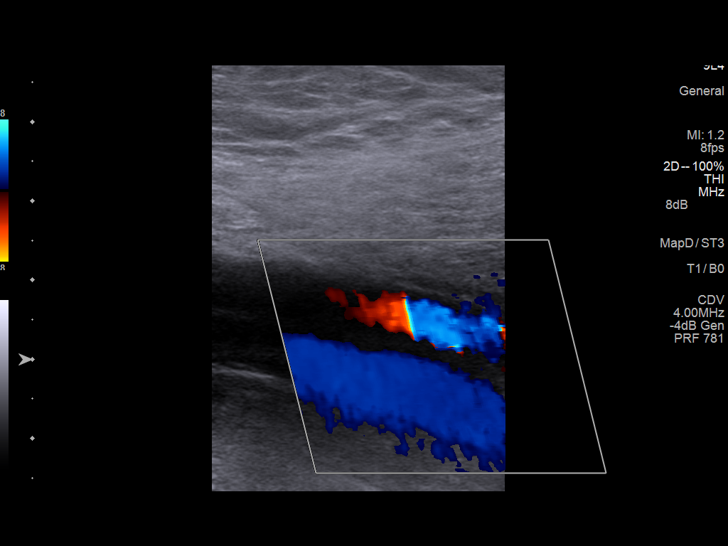
[im 23/41]
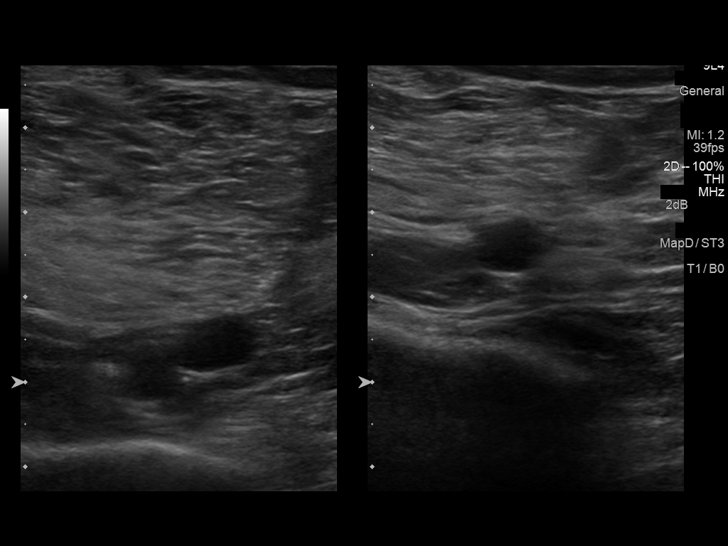
[im 27/41]
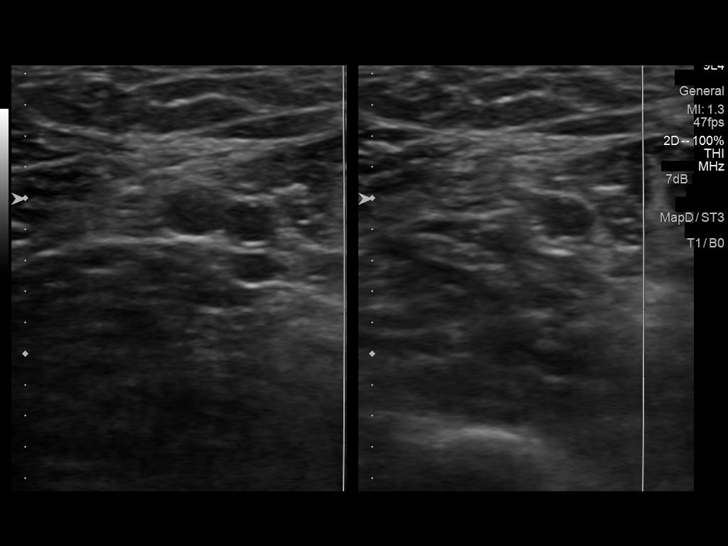
[im 30/41]
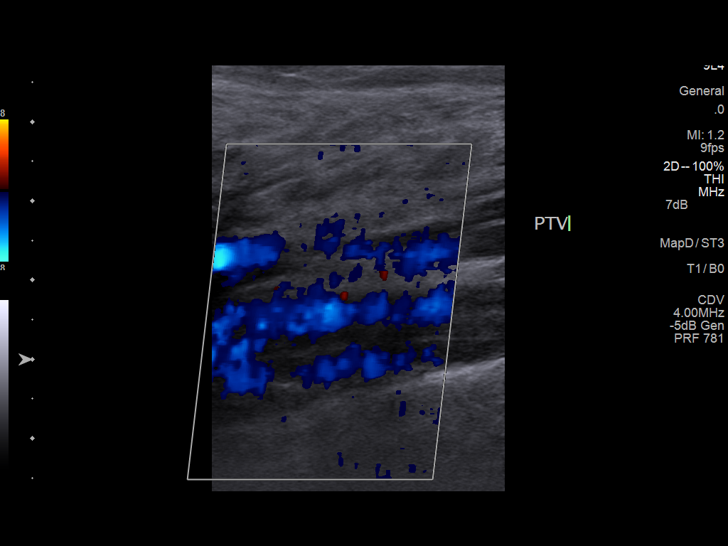
[im 34/41]
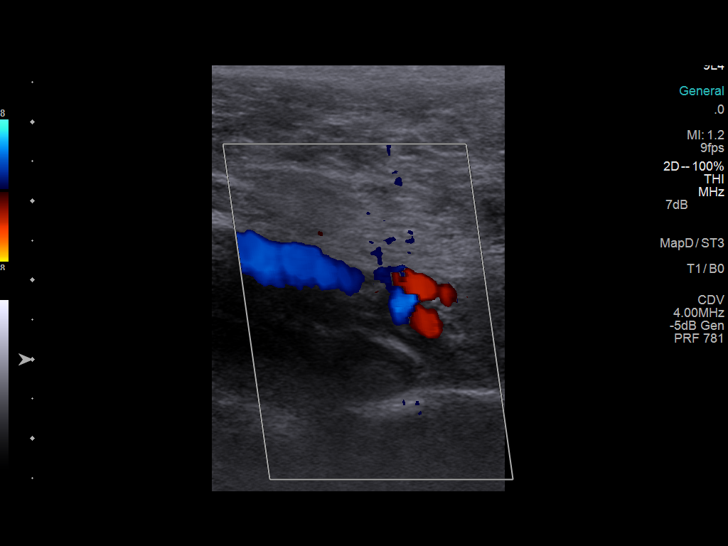
[im 37/41]
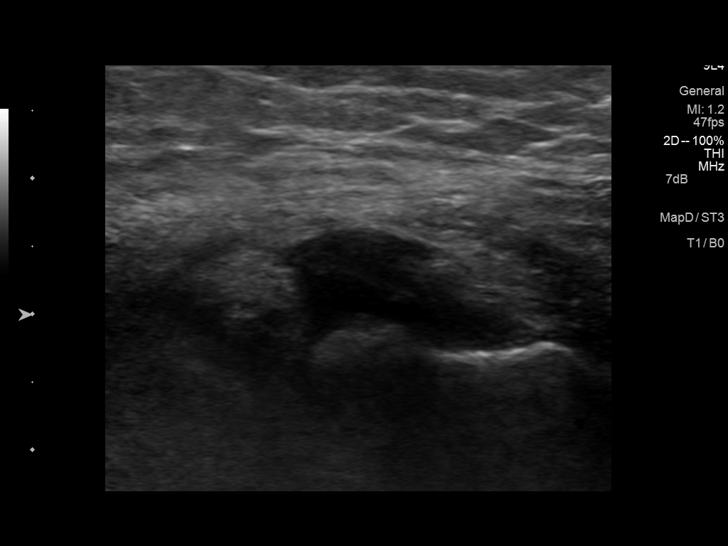
[im 41/41]
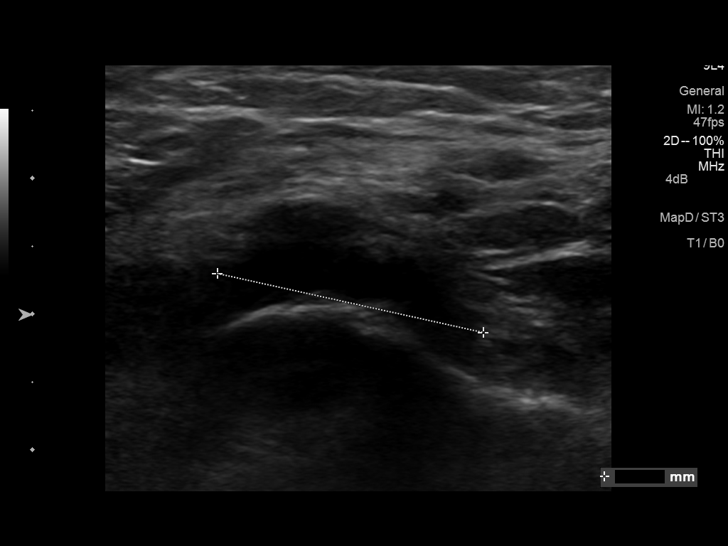

[13 of 24 positions shown; findings below may reference images not displayed]

FINDINGS: Contralateral Common Femoral Vein: Respiratory phasicity is normal
and symmetric with the symptomatic side. No evidence of thrombus.
Normal compressibility.

Common Femoral Vein: No evidence of thrombus. Normal
compressibility, respiratory phasicity and response to augmentation.

Saphenofemoral Junction: No evidence of thrombus. Normal
compressibility and flow on color Doppler imaging.

Profunda Femoral Vein: No evidence of thrombus. Normal
compressibility and flow on color Doppler imaging.

Femoral Vein: No evidence of thrombus. Normal compressibility,
respiratory phasicity and response to augmentation.

Popliteal Vein: No evidence of thrombus. Normal compressibility,
respiratory phasicity and response to augmentation.

Calf Veins: No evidence of thrombus. Normal compressibility and flow
on color Doppler imaging.

Superficial Great Saphenous Vein: No evidence of thrombus. Normal
compressibility and flow on color Doppler imaging.

Venous Reflux:  None.

Other Findings: Left popliteal cyst is noted. It measures 1.9 cm in
greatest dimension.
IMPRESSION: Left popliteal cyst.

No evidence of deep venous thrombosis.

## 2015-07-11 ENCOUNTER — Other Ambulatory Visit: Payer: Self-pay | Admitting: Family Medicine

## 2015-08-16 ENCOUNTER — Telehealth: Payer: Self-pay | Admitting: Family Medicine

## 2015-08-16 ENCOUNTER — Ambulatory Visit: Payer: BLUE CROSS/BLUE SHIELD | Admitting: Family Medicine

## 2015-08-16 MED ORDER — LISINOPRIL 5 MG PO TABS: ORAL_TABLET | ORAL | Status: DC

## 2015-08-16 NOTE — Telephone Encounter (Signed)
Patient is unable to get off of work today to make his appointment, has been out of his BP meds for 3 days, can we refill until he can get in to be seen?  Appointment scheduled for 09/07/15  [facility]  Please advise & call pt

## 2015-08-16 NOTE — Telephone Encounter (Signed)
Left message notifying patient that med was sent to pharmacy.  

## 2015-09-07 ENCOUNTER — Encounter: Payer: Self-pay | Admitting: Family Medicine

## 2015-09-07 ENCOUNTER — Ambulatory Visit (INDEPENDENT_AMBULATORY_CARE_PROVIDER_SITE_OTHER): Payer: BLUE CROSS/BLUE SHIELD | Admitting: Family Medicine

## 2015-09-07 VITALS — BP 120/68 | Ht 70.0 in | Wt 188.8 lb

## 2015-09-07 DIAGNOSIS — L570 Actinic keratosis: Secondary | ICD-10-CM | POA: Diagnosis not present

## 2015-09-07 DIAGNOSIS — Z23 Encounter for immunization: Secondary | ICD-10-CM

## 2015-09-07 DIAGNOSIS — E781 Pure hyperglyceridemia: Secondary | ICD-10-CM

## 2015-09-07 DIAGNOSIS — I10 Essential (primary) hypertension: Secondary | ICD-10-CM

## 2015-09-07 MED ORDER — LISINOPRIL 5 MG PO TABS: ORAL_TABLET | ORAL | Status: DC

## 2015-09-07 NOTE — Addendum Note (Signed)
Addended by: Lilyan Punt A on: 09/07/2015 08:21 PM   Modules accepted: Orders

## 2015-09-07 NOTE — Patient Instructions (Addendum)
Dear Patient,  It has been recommended to you that you have a colonoscopy. It is your responsibility to carry through with this recommendation.   Did you realize that colon cancer is the second leading cancer killer in the [place]. One in every 20 adults will get colon cancer. If all adults would go through the recommended screening for colon cancer (getting a colonoscopy), then there would be a 60% reduction in the number of people dying from colon cancer.  Colon cancer just doesn't come out of the blue. It starts off as a small polyp which over time grows into a cancer. A colonoscopy can prevent cancer and in many cases detected when it is at a very treatable phase. Small colon cancers can have cure rates of 95%. Advanced colon cancer, which often occurs in people who do not do their screenings, have cure rates less than 20%. The risk of colon cancer advances with age. Most adults should have regular colonoscopies every 10 years starting at age 36. This recommendation can vary depending on a person's medical history.  Health-care laws now allow for you to call the gastroenterologist office directly in order to set yourself up for this very important tests. Today we have recommended to you that you do this test. This test may save your life. Failure to do this test puts you at risk for premature death from colon cancer. Do the right thing and schedule this test now.  Here as a list of specialists we recommend in the surrounding area. When you call their office let them know that you are a patient of our practice in your interested in doing a screening colonoscopy. They should assist you without problems. You will need the following information when you called them: 1-name of which Dr. you see, 2-your insurance information, 3-a list of medications that you currently take, 4-any allergies you have to medications.   gastroenterologist Dr. Augusto Gamble, Dr Dione Housekeeper  gastroenterologist   [phone]  Dr.Najeeb Thalia [facility]   [phone]  [facility] gastroenterology (Dr. Sherle Poe and [place]) [phone]  [facility] gastroenterology (Dr. Philippa Sicks, [place], [place], [place]) [phone]  Each group of specialists has assured [place] that when you called them they will help you get your colonoscopy set up. Should you have problems or if the GI practice insist a referral be done please let us know. Be sure to call soon. Sincerely, Sherie Don, Dr Lubertha South, Dr.Vanessia Bokhari   DASH Eating Plan DASH stands for "Dietary Approaches to Stop Hypertension." The DASH eating plan is a healthy eating plan that has been shown to reduce high blood pressure (hypertension). Additional health benefits may include reducing the risk of type 2 diabetes mellitus, heart disease, and stroke. The DASH eating plan may also help with weight loss. WHAT DO I NEED TO KNOW ABOUT THE DASH EATING PLAN? For the DASH eating plan, you will follow these general guidelines:  Choose foods with a percent daily value for sodium of less than 5% (as listed on the food label).  Use salt-free seasonings or herbs instead of table salt or sea salt.  Check with your health care provider or pharmacist before using salt substitutes.  Eat lower-sodium products, often labeled as "lower sodium" or "no salt added."  Eat fresh foods.  Eat more vegetables, fruits, and low-fat dairy products.  Choose whole grains. Look for the word "whole" as the first word in the ingredient list.  Choose fish and skinless chicken or [place] more often than red meat. Limit fish, poultry, and meat to 6 oz (170 g) each day.  Limit sweets, desserts, sugars, and sugary drinks.  Choose heart-healthy fats.  Limit cheese to 1 oz (28 g) per day.  Eat more home-cooked food and less restaurant, buffet, and fast food.  Limit fried  foods.  Cook foods using methods other than frying.  Limit canned vegetables. If you do use them, rinse them well to decrease the sodium.  When eating at a restaurant, ask that your food be prepared with less salt, or no salt if possible. WHAT FOODS CAN I EAT? Seek help from a dietitian for individual calorie needs. Grains Whole grain or whole wheat bread. Brown rice. Whole grain or whole wheat pasta. Quinoa, bulgur, and whole grain cereals. Low-sodium cereals. Corn or whole wheat flour tortillas. Whole grain cornbread. Whole grain crackers. Low-sodium crackers. Vegetables Fresh or frozen vegetables (raw, steamed, roasted, or grilled). Low-sodium or reduced-sodium tomato and vegetable juices. Low-sodium or reduced-sodium tomato sauce and paste. Low-sodium or reduced-sodium canned vegetables.  Fruits All fresh, canned (in natural juice), or frozen fruits. Meat and Other Protein Products Ground beef (85% or leaner), grass-fed beef, or beef trimmed of fat. Skinless chicken or [place]. Ground chicken or [place]. Pork trimmed of fat. All fish and seafood. Eggs. Dried beans, peas, or lentils. Unsalted nuts and seeds. Unsalted canned beans. Dairy Low-fat dairy products, such as skim or 1% milk, 2% or reduced-fat cheeses, low-fat ricotta or cottage cheese, or plain low-fat yogurt. Low-sodium or reduced-sodium cheeses. Fats and Oils Tub margarines without trans fats. Light or reduced-fat mayonnaise and salad dressings (reduced sodium). Avocado. Safflower, olive, or canola oils. Natural peanut or almond butter. Other Unsalted popcorn and pretzels. The items listed above may not be a complete list of recommended foods or beverages. Contact your dietitian for more options. WHAT FOODS ARE NOT RECOMMENDED? Grains White bread. White pasta. White rice. Refined cornbread. Bagels and croissants. Crackers that contain trans fat. Vegetables Creamed or fried vegetables. Vegetables in a cheese sauce. Regular  canned vegetables. Regular canned tomato sauce and paste. Regular tomato and vegetable juices. Fruits Dried fruits. Canned fruit in light or heavy syrup. Fruit juice. Meat and Other Protein Products Fatty cuts of meat. Ribs, chicken wings, bacon, sausage, bologna, salami, chitterlings, fatback, hot dogs, bratwurst, and packaged luncheon meats. Salted nuts and seeds. Canned beans with salt. Dairy Whole or 2% milk, cream, half-and-half, and cream cheese. Whole-fat or sweetened yogurt. Full-fat cheeses or blue cheese. Nondairy creamers and whipped toppings. Processed cheese, cheese spreads, or cheese curds. Condiments Onion and garlic salt, seasoned salt, table salt, and sea salt. Canned and packaged gravies. Worcestershire sauce. Tartar sauce. Barbecue sauce. Teriyaki sauce. Soy sauce, including reduced sodium. Steak sauce. Fish sauce. Oyster sauce. Cocktail sauce. Horseradish. Ketchup and mustard. Meat flavorings and tenderizers. Bouillon cubes. Hot sauce. Tabasco sauce. Marinades. Taco seasonings. Relishes. Fats and Oils Butter, stick margarine, lard, shortening, ghee, and bacon fat. Coconut, palm kernel, or palm oils. Regular salad dressings. Other Pickles and olives. Salted popcorn and pretzels. The items listed above may not be a complete list of foods and beverages to avoid. Contact your dietitian for more information. WHERE CAN I FIND MORE INFORMATION? National Heart, Lung, and Blood Institute: [employer]it   This information is not intended to replace advice given to you by your health care provider. Make sure you discuss any questions you have with your health care provider.  Document Released: 11/07/2011 Document Revised: 12/09/2014 Document Reviewed: 09/22/2013 Elsevier Interactive Patient Education [employer].

## 2015-09-07 NOTE — Progress Notes (Addendum)
   Subjective:    Patient ID: Scott Villarreal, male    DOB: 01/14/1966, 49 y.o.   MRN: [id number]  Hypertension This is a chronic problem. The current episode started more than 1 year ago. Pertinent negatives include no chest pain. Risk factors for coronary artery disease include dyslipidemia and male gender. Treatments tried: lisinopril. There are no compliance problems.    patient [place] he does try to watch how he eats he does not smoke he takes his medicine on a regular basis states not having any side effects blood pressure readings are good when he checks    Review of Systems  Constitutional: Negative for activity change, appetite change and fatigue.  HENT: Negative for congestion.   Respiratory: Negative for cough.   Cardiovascular: Negative for chest pain.  Gastrointestinal: Negative for abdominal pain.  Endocrine: Negative for polydipsia and polyphagia.  Neurological: Negative for weakness.  Psychiatric/Behavioral: Negative for confusion.       Objective:   Physical Exam  Constitutional: He appears well-nourished. No distress.  Cardiovascular: Normal rate, regular rhythm and normal heart sounds.   No murmur heard. Pulmonary/Chest: Effort normal and breath sounds normal. No respiratory distress.  Musculoskeletal: He exhibits no edema.  Lymphadenopathy:    He has no cervical adenopathy.  Neurological: He is alert.  Psychiatric: His behavior is normal.  Vitals reviewed.  IActinic keratosis right ear       Assessment & Plan:  High blood pressure good control continue current medications. Watch diet closely. Check lab work. Patient defers on prostate exam today. Information on colonoscopy given. Follow-up again in 6 months.  Ref to derm possible skin cancer

## 2015-09-20 ENCOUNTER — Encounter: Payer: Self-pay | Admitting: Family Medicine

## 2016-02-23 ENCOUNTER — Encounter: Payer: Self-pay | Admitting: Family Medicine

## 2016-02-23 ENCOUNTER — Ambulatory Visit (INDEPENDENT_AMBULATORY_CARE_PROVIDER_SITE_OTHER): Payer: BLUE CROSS/BLUE SHIELD | Admitting: Family Medicine

## 2016-02-23 VITALS — BP 114/70 | Temp 98.3°F | Ht 70.0 in | Wt 191.0 lb

## 2016-02-23 DIAGNOSIS — J329 Chronic sinusitis, unspecified: Secondary | ICD-10-CM

## 2016-02-23 DIAGNOSIS — J310 Chronic rhinitis: Secondary | ICD-10-CM

## 2016-02-23 MED ORDER — AZITHROMYCIN 250 MG PO TABS: ORAL_TABLET | ORAL | Status: DC

## 2016-02-23 NOTE — Progress Notes (Signed)
   Subjective:    Patient ID: Evonnie PatEdward Lee Azar, male    DOB: 05/10/1966, 50 y.o.   MRN: [id number]  Dizziness This is a new problem. The current episode started in the past 7 days. The problem has been unchanged. Associated symptoms include chills, myalgias, nausea and a sore throat. Nothing aggravates the symptoms. He has tried nothing for the symptoms. The treatment provided no relief.   Cold sweats   Throat painful  achey   Light-headed achey diffusely  dul throb shoulders elbow and wrist nont felin gthe best   [age] yr old past couple days ,     Review of Systems  Constitutional: Positive for chills.  HENT: Positive for sore throat.   Gastrointestinal: Positive for nausea.  Musculoskeletal: Positive for myalgias.  Neurological: Positive for dizziness.       Objective:   Physical Exam Alert, mild malaise. Hydration good Vitals stable. frontal/ maxillary tenderness evident positive nasal congestion. pharynx normal neck supple  lungs clear/no crackles or wheezes. heart regular in rhythm        Assessment & Plan:  Impression potential flu/rhinosinusitis likely post viral, discussed with patient. plan antibiotics prescribed. Questions answered. Symptomatic care discussed. warning signs discussed. WSL

## 2016-03-08 ENCOUNTER — Ambulatory Visit: Payer: BLUE CROSS/BLUE SHIELD | Admitting: Family Medicine

## 2016-05-10 ENCOUNTER — Telehealth: Payer: Self-pay | Admitting: Family Medicine

## 2016-05-10 DIAGNOSIS — Z125 Encounter for screening for malignant neoplasm of prostate: Secondary | ICD-10-CM

## 2016-05-10 DIAGNOSIS — R5383 Other fatigue: Secondary | ICD-10-CM

## 2016-05-10 DIAGNOSIS — I10 Essential (primary) hypertension: Secondary | ICD-10-CM

## 2016-05-10 NOTE — Telephone Encounter (Signed)
Left message on voicemail notifying patient that blood work has been ordered.  

## 2016-05-10 NOTE — Telephone Encounter (Signed)
Patient may have CBC and CMP also needs lipid profile and PSA

## 2016-05-10 NOTE — Telephone Encounter (Signed)
Needs bw for job ordered needs CBC CMP fasting  Other labs as needed   Last labs 09/07/15 Lip BMP

## 2016-05-21 ENCOUNTER — Encounter: Payer: Self-pay | Admitting: Family Medicine

## 2016-05-21 ENCOUNTER — Ambulatory Visit (INDEPENDENT_AMBULATORY_CARE_PROVIDER_SITE_OTHER): Payer: BLUE CROSS/BLUE SHIELD | Admitting: Family Medicine

## 2016-05-21 ENCOUNTER — Telehealth: Payer: Self-pay | Admitting: Family Medicine

## 2016-05-21 VITALS — BP 124/94 | Temp 98.7°F | Ht 69.0 in | Wt 186.0 lb

## 2016-05-21 DIAGNOSIS — R42 Dizziness and giddiness: Secondary | ICD-10-CM | POA: Diagnosis not present

## 2016-05-21 DIAGNOSIS — IMO0001 Reserved for inherently not codable concepts without codable children: Secondary | ICD-10-CM

## 2016-05-21 DIAGNOSIS — I10 Essential (primary) hypertension: Secondary | ICD-10-CM | POA: Diagnosis not present

## 2016-05-21 DIAGNOSIS — R030 Elevated blood-pressure reading, without diagnosis of hypertension: Secondary | ICD-10-CM

## 2016-05-21 DIAGNOSIS — Z125 Encounter for screening for malignant neoplasm of prostate: Secondary | ICD-10-CM | POA: Diagnosis not present

## 2016-05-21 DIAGNOSIS — R5383 Other fatigue: Secondary | ICD-10-CM | POA: Diagnosis not present

## 2016-05-21 MED ORDER — LISINOPRIL 10 MG PO TABS: ORAL_TABLET | ORAL | Status: DC

## 2016-05-21 NOTE — Patient Instructions (Signed)
DASH Eating Plan  DASH stands for "Dietary Approaches to Stop Hypertension." The DASH eating plan is a healthy eating plan that has been shown to reduce high blood pressure (hypertension). Additional health benefits may include reducing the risk of type 2 diabetes mellitus, heart disease, and stroke. The DASH eating plan may also help with weight loss.  WHAT DO I NEED TO KNOW ABOUT THE DASH EATING PLAN?  For the DASH eating plan, you will follow these general guidelines:  · Choose foods with a percent daily value for sodium of less than 5% (as listed on the food label).  · Use salt-free seasonings or herbs instead of table salt or sea salt.  · Check with your health care provider or pharmacist before using salt substitutes.  · Eat lower-sodium products, often labeled as "lower sodium" or "no salt added."  · Eat fresh foods.  · Eat more vegetables, fruits, and low-fat dairy products.  · Choose whole grains. Look for the word "whole" as the first word in the ingredient list.  · Choose fish and skinless chicken or turkey more often than red meat. Limit fish, poultry, and meat to 6 oz (170 g) each day.  · Limit sweets, desserts, sugars, and sugary drinks.  · Choose heart-healthy fats.  · Limit cheese to 1 oz (28 g) per day.  · Eat more home-cooked food and less restaurant, buffet, and fast food.  · Limit fried foods.  · Cook foods using methods other than frying.  · Limit canned vegetables. If you do use them, rinse them well to decrease the sodium.  · When eating at a restaurant, ask that your food be prepared with less salt, or no salt if possible.  WHAT FOODS CAN I EAT?  Seek help from a dietitian for individual calorie needs.  Grains  Whole grain or whole wheat bread. Brown rice. Whole grain or whole wheat pasta. Quinoa, bulgur, and whole grain cereals. Low-sodium cereals. Corn or whole wheat flour tortillas. Whole grain cornbread. Whole grain crackers. Low-sodium crackers.  Vegetables  Fresh or frozen vegetables  (raw, steamed, roasted, or grilled). Low-sodium or reduced-sodium tomato and vegetable juices. Low-sodium or reduced-sodium tomato sauce and paste. Low-sodium or reduced-sodium canned vegetables.   Fruits  All fresh, canned (in natural juice), or frozen fruits.  Meat and Other Protein Products  Ground beef (85% or leaner), grass-fed beef, or beef trimmed of fat. Skinless chicken or turkey. Ground chicken or turkey. Pork trimmed of fat. All fish and seafood. Eggs. Dried beans, peas, or lentils. Unsalted nuts and seeds. Unsalted canned beans.  Dairy  Low-fat dairy products, such as skim or 1% milk, 2% or reduced-fat cheeses, low-fat ricotta or cottage cheese, or plain low-fat yogurt. Low-sodium or reduced-sodium cheeses.  Fats and Oils  Tub margarines without trans fats. Light or reduced-fat mayonnaise and salad dressings (reduced sodium). Avocado. Safflower, olive, or canola oils. Natural peanut or almond butter.  Other  Unsalted popcorn and pretzels.  The items listed above may not be a complete list of recommended foods or beverages. Contact your dietitian for more options.  WHAT FOODS ARE NOT RECOMMENDED?  Grains  White bread. White pasta. White rice. Refined cornbread. Bagels and croissants. Crackers that contain trans fat.  Vegetables  Creamed or fried vegetables. Vegetables in a cheese sauce. Regular canned vegetables. Regular canned tomato sauce and paste. Regular tomato and vegetable juices.  Fruits  Dried fruits. Canned fruit in light or heavy syrup. Fruit juice.  Meat and Other Protein   Products  Fatty cuts of meat. Ribs, chicken wings, bacon, sausage, bologna, salami, chitterlings, fatback, hot dogs, bratwurst, and packaged luncheon meats. Salted nuts and seeds. Canned beans with salt.  Dairy  Whole or 2% milk, cream, half-and-half, and cream cheese. Whole-fat or sweetened yogurt. Full-fat cheeses or blue cheese. Nondairy creamers and whipped toppings. Processed cheese, cheese spreads, or cheese  curds.  Condiments  Onion and garlic salt, seasoned salt, table salt, and sea salt. Canned and packaged gravies. Worcestershire sauce. Tartar sauce. Barbecue sauce. Teriyaki sauce. Soy sauce, including reduced sodium. Steak sauce. Fish sauce. Oyster sauce. Cocktail sauce. Horseradish. Ketchup and mustard. Meat flavorings and tenderizers. Bouillon cubes. Hot sauce. Tabasco sauce. Marinades. Taco seasonings. Relishes.  Fats and Oils  Butter, stick margarine, lard, shortening, ghee, and bacon fat. Coconut, palm kernel, or palm oils. Regular salad dressings.  Other  Pickles and olives. Salted popcorn and pretzels.  The items listed above may not be a complete list of foods and beverages to avoid. Contact your dietitian for more information.  WHERE CAN I FIND MORE INFORMATION?  National Heart, Lung, and Blood Institute: www.nhlbi.nih.gov/health/health-topics/topics/dash/     This information is not intended to replace advice given to you by your health care provider. Make sure you discuss any questions you have with your health care provider.     Document Released: 11/07/2011 Document Revised: 12/09/2014 Document Reviewed: 09/22/2013  Elsevier Interactive Patient Education ©2016 Elsevier Inc.

## 2016-05-21 NOTE — Telephone Encounter (Signed)
Pt was checking out and you requested he come back next wed the  28th for BP recheck, he also has an appt that Friday for med check/Ins papers For work. Pt wanting to know if he needs to keep both appts?   Please advise

## 2016-05-21 NOTE — Progress Notes (Signed)
   Subjective:    Patient ID: Scott Villarreal, male    DOB: 04/17/1966, 50 y.o.   MRN: [id number]  Headache  This is a new problem. Episode onset: days ago. The problem occurs daily. The pain is located in the bilateral region. Associated symptoms include dizziness, nausea and numbness. Pertinent negatives include no coughing, fever, neck pain, sore throat or vomiting. Associated symptoms comments: Dizziness, cold sweats, right hand tingling.  Dizziness This is a new problem. The current episode started yesterday. The problem occurs constantly. Associated symptoms include chills, headaches, nausea and numbness. Pertinent negatives include no chest pain, congestion, coughing, fever, neck pain, sore throat or vomiting.  He denies any numbness. He just states he felt unsteady sweaty and didn't feel like himself this occurred earlier today this scared him. He denied any chest tightness pressure pain. Patient does have a history of hypertension. Patient does admit to using alcohol sometimes more than what he should. States he has not been using alcohol recently. Denied any unilateral numbness or weakness. States he had a clammy feeling to his right fingertips. Clammy feel to the right fingertips No  Night numbness Balance and strength ok  Review of Systems  Constitutional: Positive for chills. Negative for fever.  HENT: Negative for congestion and sore throat.   Respiratory: Negative for cough.   Cardiovascular: Negative for chest pain.  Gastrointestinal: Positive for nausea. Negative for vomiting.  Musculoskeletal: Negative for neck pain.  Neurological: Positive for dizziness, numbness and headaches.   EKG was ordered and look normal blood pressure was rechecked and was improved still elevated at 124/94    Objective:   Physical Exam  Constitutional: He appears well-nourished. No distress.  Cardiovascular: Normal rate, regular rhythm and normal heart sounds.   No murmur heard. Pulmonary/Chest:  Effort normal and breath sounds normal. No respiratory distress.  Musculoskeletal: He exhibits no edema.  Lymphadenopathy:    He has no cervical adenopathy.  Neurological: He is alert.  Psychiatric: His behavior is normal.  Vitals reviewed.         Assessment & Plan:  Elevated blood pressure I am concerned about the possibility of this causing his mild headache. We are increasing lisinopril from 5 mg to 10 mg I encouraged a healthy diet cut back on salt use once feeling better start moderate physical activity follow-up middle of next week for recheck Patient will do lab work follow-up next week  I doubt that there is a stroke going on anything some of his dizziness and sweating was related to the fact that he had back pain the previous night and took a friend's pain pill he does not know what it was he states it was a white pill and he felt dizzy the following day.

## 2016-05-22 ENCOUNTER — Encounter: Payer: Self-pay | Admitting: Family Medicine

## 2016-05-22 LAB — CBC WITH DIFFERENTIAL/PLATELET
BASOS ABS: 0 10*3/uL (ref 0.0–0.2)
Basos: 0 %
EOS (ABSOLUTE): 0.2 10*3/uL (ref 0.0–0.4)
EOS: 4 %
HEMATOCRIT: 47 % (ref 37.5–51.0)
HEMOGLOBIN: 16.2 g/dL (ref 12.6–17.7)
Immature Grans (Abs): 0 10*3/uL (ref 0.0–0.1)
Immature Granulocytes: 1 %
LYMPHS ABS: 2.1 10*3/uL (ref 0.7–3.1)
Lymphs: 40 %
MCH: 30.4 pg (ref 26.6–33.0)
MCHC: 34.5 g/dL (ref 31.5–35.7)
MCV: 88 fL (ref 79–97)
MONOCYTES: 10 %
MONOS ABS: 0.5 10*3/uL (ref 0.1–0.9)
NEUTROS ABS: 2.3 10*3/uL (ref 1.4–7.0)
Neutrophils: 45 %
Platelets: 289 10*3/uL (ref 150–379)
RBC: 5.33 x10E6/uL (ref 4.14–5.80)
RDW: 14 % (ref 12.3–15.4)
WBC: 5.1 10*3/uL (ref 3.4–10.8)

## 2016-05-22 LAB — LIPID PANEL
CHOLESTEROL TOTAL: 290 mg/dL — AB (ref 100–199)
Chol/HDL Ratio: 5.9 ratio units — ABNORMAL HIGH (ref 0.0–5.0)
HDL: 49 mg/dL (ref >39)
LDL Calculated: 162 mg/dL — ABNORMAL HIGH (ref 0–99)
Triglycerides: 397 mg/dL — ABNORMAL HIGH (ref 0–149)
VLDL CHOLESTEROL CAL: 79 mg/dL — AB (ref 5–40)

## 2016-05-22 LAB — PSA: Prostate Specific Ag, Serum: 0.4 ng/mL (ref 0.0–4.0)

## 2016-05-23 NOTE — Telephone Encounter (Signed)
I was niot aware his appt was with us on next Friday as well when I rec him to come on weds -since he is having ongoing issues I rec keep ov for coming weds and cancel next fridays

## 2016-05-23 NOTE — Telephone Encounter (Signed)
Pt called back to check on this question this morning And he also had an additional question regarding the med Change.   He was told to increase his BP pill to BID, is bottom number has Dropped considerably and he states he feels yucky now an wants To know if this is normal or should he be concerned.   Please advise

## 2016-05-23 NOTE — Telephone Encounter (Signed)
Notified patient his blood pressure is actually better now that we have adjusted his medication Dr. Lorin PicketScott would not recommend changing it just yet if he is willing encourage him to try to continue with this medicine until his appointment next Wednesday and we will recheck his blood pressure with sitting and standing at that time if he states he just cannot tolerate this then we can try some additional measures different than what we are doing now. Patient verbalized understanding.

## 2016-05-23 NOTE — Telephone Encounter (Signed)
Nurse plz call, see what numbers are now, any dizziness or sx when he stands ?

## 2016-05-23 NOTE — Telephone Encounter (Signed)
Patient states that his blood pressure now is 140/75. Patient states that he has a little pressure on his temples and some light headedness. Doesn't change with sitting or standing.

## 2016-05-23 NOTE — Telephone Encounter (Signed)
His blood pressure is actually better now that we have adjusted his medication I would not recommend changing it just yet if he is willing I would encourage him to try to continue with this medicine until his appointment next Wednesday and we will recheck his blood pressure with sitting and standing at that time if he states he just cannot tolerate this then we can try some additional measures different than what we are doing now

## 2016-05-29 ENCOUNTER — Encounter: Payer: Self-pay | Admitting: Family Medicine

## 2016-05-29 ENCOUNTER — Telehealth: Payer: Self-pay | Admitting: *Deleted

## 2016-05-29 ENCOUNTER — Ambulatory Visit (INDEPENDENT_AMBULATORY_CARE_PROVIDER_SITE_OTHER): Payer: BLUE CROSS/BLUE SHIELD | Admitting: Family Medicine

## 2016-05-29 VITALS — BP 124/86 | Ht 69.0 in | Wt 188.5 lb

## 2016-05-29 DIAGNOSIS — Z0000 Encounter for general adult medical examination without abnormal findings: Secondary | ICD-10-CM

## 2016-05-29 DIAGNOSIS — E785 Hyperlipidemia, unspecified: Secondary | ICD-10-CM | POA: Diagnosis not present

## 2016-05-29 DIAGNOSIS — I10 Essential (primary) hypertension: Secondary | ICD-10-CM

## 2016-05-29 MED ORDER — SILDENAFIL CITRATE 100 MG PO TABS: 50.0000 mg | ORAL_TABLET | Freq: Every day | ORAL | Status: DC | PRN

## 2016-05-29 NOTE — Telephone Encounter (Signed)
Please call [facility]. May use sildenafil 20 mg tablet may take 3 pills-up to 5 pills before relations as necessary do not use more than once per day. #25, 4 refills

## 2016-05-29 NOTE — Telephone Encounter (Signed)
[place] [facility]. Can viagra 100mg  be changed to sildenafil 20mg  due to cost of viagra

## 2016-05-29 NOTE — Patient Instructions (Signed)
Aspirin and Your Heart  Aspirin is a medicine that affects the way blood clots. Aspirin can be used to help reduce the risk of blood clots, heart attacks, and other heart-related problems.  SHOULD I TAKE ASPIRIN? Your health care provider will help you determine whether it is safe and beneficial for you to take aspirin daily. Taking aspirin daily may be beneficial if you:  Have had a heart attack or chest pain.  Have undergone open heart surgery such as coronary artery bypass surgery (CABG).  Have had coronary angioplasty.  Have experienced a stroke or transient ischemic attack (TIA).  Have peripheral vascular disease (PVD).  Have chronic heart rhythm problems such as atrial fibrillation. ARE THERE ANY RISKS OF TAKING ASPIRIN DAILY? Daily use of aspirin can increase your risk of side effects. Some of these include:  Bleeding. Bleeding problems can be minor or serious. An example of a minor problem is a cut that does not stop bleeding. An example of a more serious problem is stomach bleeding or bleeding into the brain. Your risk of bleeding is increased if you are also taking non-steroidal anti-inflammatory medicine (NSAIDs).  Increased bruising.  Upset stomach.  An allergic reaction. People who have nasal polyps have an increased risk of developing an aspirin allergy. WHAT ARE SOME GUIDELINES I SHOULD FOLLOW WHEN TAKING ASPIRIN?   Take aspirin only as directed by your health care provider. Make sure you understand how much you should take and what form you should take. The two forms of aspirin are:  Non-enteric-coated. This type of aspirin does not have a coating and is absorbed quickly. Non-enteric-coated aspirin is usually recommended for people with chest pain. This type of aspirin also comes in a chewable form.  Enteric-coated. This type of aspirin has a special coating that releases the medicine very slowly. Enteric-coated aspirin causes less stomach upset than non-enteric-coated  aspirin. This type of aspirin should not be chewed or crushed.  Drink alcohol in moderation. Drinking alcohol increases your risk of bleeding. WHEN SHOULD I SEEK MEDICAL CARE?   You have unusual bleeding or bruising.  You have stomach pain.  You have an allergic reaction. Symptoms of an allergic reaction include:  Hives.  Itchy skin.  Swelling of the lips, tongue, or face.  You have ringing in your ears. WHEN SHOULD I SEEK IMMEDIATE MEDICAL CARE?   Your bowel movements are bloody, dark red, or black in color.  You vomit or cough up blood.  You have blood in your urine.  You cough, wheeze, or feel short of breath. If you have any of the following symptoms, this is an emergency. Do not wait to see if the pain will go away. Get medical help at once. Call your local emergency services (911 in the [place] Do not drive yourself to the hospital.  You have severe chest pain, especially if the pain is crushing or pressure-like and spreads to the arms, back, neck, or jaw.  You have stroke-like symptoms, such as:   Loss of vision.   Difficulty talking.   Numbness or weakness on one side of your body.   Numbness or weakness in your arm or leg.   Not thinking clearly or feeling confused.    This information is not intended to replace advice given to you by your health care provider. Make sure you discuss any questions you have with your health care provider.   Document Released: 10/31/2008 Document Revised: 12/09/2014 Document Reviewed: 02/23/2014 Elsevier Interactive Patient Education 2016 Elsevier   Inc. Fat and Cholesterol Restricted Diet High levels of fat and cholesterol in your blood may lead to various health problems, such as diseases of the heart, blood vessels, gallbladder, liver, and pancreas. Fats are concentrated sources of energy that come in various forms. Certain types of fat, including saturated fat, may be harmful in excess. Cholesterol is a substance needed  by your body in small amounts. Your body makes all the cholesterol it needs. Excess cholesterol comes from the food you eat. When you have high levels of cholesterol and saturated fat in your blood, health problems can develop because the excess fat and cholesterol will gather along the walls of your blood vessels, causing them to narrow. Choosing the right foods will help you control your intake of fat and cholesterol. This will help keep the levels of these substances in your blood within normal limits and reduce your risk of disease. WHAT IS MY PLAN? Your health care provider recommends that you:  Get no more than __________ % of the total calories in your daily diet from fat.  Limit your intake of saturated fat to less than ______% of your total calories each day.  Limit the amount of cholesterol in your diet to less than _________mg per day. WHAT TYPES OF FAT SHOULD I CHOOSE?  Choose healthy fats more often. Choose monounsaturated and polyunsaturated fats, such as olive and canola oil, flaxseeds, walnuts, almonds, and seeds.  Eat more omega-3 fats. Good choices include salmon, mackerel, sardines, tuna, flaxseed oil, and ground flaxseeds. Aim to eat fish at least two times a week.  Limit saturated fats. Saturated fats are primarily found in animal products, such as meats, butter, and cream. Plant sources of saturated fats include palm oil, palm kernel oil, and coconut oil.  Avoid foods with partially hydrogenated oils in them. These contain trans fats. Examples of foods that contain trans fats are stick margarine, some tub margarines, cookies, crackers, and other baked goods. WHAT GENERAL GUIDELINES DO I NEED TO FOLLOW? These guidelines for healthy eating will help you control your intake of fat and cholesterol:  Check food labels carefully to identify foods with trans fats or high amounts of saturated fat.  Fill one half of your plate with vegetables and green salads.  Fill one fourth  of your plate with whole grains. Look for the word "whole" as the first word in the ingredient list.  Fill one fourth of your plate with lean protein foods.  Limit fruit to two servings a day. Choose fruit instead of juice.  Eat more foods that contain soluble fiber. Examples of foods that contain this type of fiber are apples, broccoli, carrots, beans, peas, and barley. Aim to get 20-30 g of fiber per day.  Eat more home-cooked food and less restaurant, buffet, and fast food.  Limit or avoid alcohol.  Limit foods high in starch and sugar.  Limit fried foods.  Cook foods using methods other than frying. Baking, boiling, grilling, and broiling are all great options.  Lose weight if you are overweight. Losing just 5-10% of your initial body weight can help your overall health and prevent diseases such as diabetes and heart disease. WHAT FOODS CAN I EAT? Grains Whole grains, such as whole wheat or whole grain breads, crackers, cereals, and pasta. Unsweetened oatmeal, bulgur, barley, quinoa, or brown rice. Corn or whole wheat flour tortillas. Vegetables Fresh or frozen vegetables (raw, steamed, roasted, or grilled). Green salads. Fruits All fresh, canned (in natural juice), or frozen fruits.  Meat and Other Protein Products Ground beef (85% or leaner), grass-fed beef, or beef trimmed of fat. Skinless chicken or [place]. Ground chicken or [place]. Pork trimmed of fat. All fish and seafood. Eggs. Dried beans, peas, or lentils. Unsalted nuts or seeds. Unsalted canned or dry beans. Dairy Low-fat dairy products, such as skim or 1% milk, 2% or reduced-fat cheeses, low-fat ricotta or cottage cheese, or plain low-fat yogurt. Fats and Oils Tub margarines without trans fats. Light or reduced-fat mayonnaise and salad dressings. Avocado. Olive, canola, sesame, or safflower oils. Natural peanut or almond butter (choose ones without added sugar and oil). The items listed above may not be a complete list  of recommended foods or beverages. Contact your dietitian for more options. WHAT FOODS ARE NOT RECOMMENDED? Grains White bread. White pasta. White rice. Cornbread. Bagels, pastries, and croissants. Crackers that contain trans fat. Vegetables White potatoes. Corn. Creamed or fried vegetables. Vegetables in a cheese sauce. Fruits Dried fruits. Canned fruit in light or heavy syrup. Fruit juice. Meat and Other Protein Products Fatty cuts of meat. Ribs, chicken wings, bacon, sausage, bologna, salami, chitterlings, fatback, hot dogs, bratwurst, and packaged luncheon meats. Liver and organ meats. Dairy Whole or 2% milk, cream, half-and-half, and cream cheese. Whole milk cheeses. Whole-fat or sweetened yogurt. Full-fat cheeses. Nondairy creamers and whipped toppings. Processed cheese, cheese spreads, or cheese curds. Sweets and Desserts Corn syrup, sugars, honey, and molasses. Candy. Jam and jelly. Syrup. Sweetened cereals. Cookies, pies, cakes, donuts, muffins, and ice cream. Fats and Oils Butter, stick margarine, lard, shortening, ghee, or bacon fat. Coconut, palm kernel, or palm oils. Beverages Alcohol. Sweetened drinks (such as sodas, lemonade, and fruit drinks or punches). The items listed above may not be a complete list of foods and beverages to avoid. Contact your dietitian for more information.   This information is not intended to replace advice given to you by your health care provider. Make sure you discuss any questions you have with your health care provider.   Document Released: 11/18/2005 Document Revised: 12/09/2014 Document Reviewed: 02/16/2014 Elsevier Interactive Patient Education 2016 Elsevier Inc. Cholesterol Cholesterol is a white, waxy, fat-like substance needed by your body in small amounts. The liver makes all the cholesterol you need. Cholesterol is carried from the liver by the blood through the blood vessels. Deposits of cholesterol (plaque) may build up on blood vessel  walls. These make the arteries narrower and stiffer. Cholesterol plaques increase the risk for heart attack and stroke.  You cannot feel your cholesterol level even if it is very high. The only way to know it is high is with a blood test. Once you know your cholesterol levels, you should keep a record of the test results. Work with your health care provider to keep your levels in the desired range.  WHAT DO THE RESULTS MEAN?  Total cholesterol is a rough measure of all the cholesterol in your blood.   LDL is the so-called bad cholesterol. This is the type that deposits cholesterol in the walls of the arteries. You want this level to be low.   HDL is the good cholesterol because it cleans the arteries and carries the LDL away. You want this level to be high.  Triglycerides are fat that the body can either burn for energy or store. High levels are closely linked to heart disease.  WHAT ARE THE DESIRED LEVELS OF CHOLESTEROL?  Total cholesterol below 200.   LDL below 100 for people at risk, below 70 for those  at very high risk.   HDL above 50 is good, above 60 is best.   Triglycerides below 150.  HOW CAN I LOWER MY CHOLESTEROL?  Diet. Follow your diet programs as directed by your health care provider.   Choose fish or white meat chicken and [place], roasted or baked. Limit fatty cuts of red meat, fried foods, and processed meats, such as sausage and lunch meats.   Eat lots of fresh fruits and vegetables.  Choose whole grains, beans, pasta, potatoes, and cereals.   Use only small amounts of olive, corn, or canola oils.   Avoid butter, mayonnaise, shortening, or palm kernel oils.  Avoid foods with trans fats.   Drink skim or nonfat milk and eat low-fat or nonfat yogurt and cheeses. Avoid whole milk, cream, ice cream, egg yolks, and full-fat cheeses.   Healthy desserts include angel food cake, ginger snaps, animal crackers, hard candy, popsicles, and low-fat or nonfat  frozen yogurt. Avoid pastries, cakes, pies, and cookies.   Exercise. Follow your exercise programs as directed by your health care provider.   A regular program helps decrease LDL and raise HDL.   A regular program helps with weight control.   Do things that increase your activity level like gardening, walking, or taking the stairs. Ask your health care provider about how you can be more active in your daily life.   Medicine. Take medicine only as directed by your health care provider.   Medicine may be prescribed by your health care provider to help lower cholesterol and decrease the risk for heart disease.   If you have several risk factors, you may need medicine even if your levels are normal.   This information is not intended to replace advice given to you by your health care provider. Make sure you discuss any questions you have with your health care provider.   Document Released: 08/13/2001 Document Revised: 12/09/2014 Document Reviewed: 09/01/2013 Elsevier Interactive Patient Education [employer]2016 Elsevier Inc.

## 2016-05-29 NOTE — Progress Notes (Signed)
Subjective:    Patient ID: Scott Villarreal, male    DOB: October 28, 1966, 50 y.o.   MRN: [id number]  Hypertension This is a chronic problem. The current episode started more than 1 month ago. The problem has been gradually improving since onset. Pertinent negatives include no chest pain, headaches or neck pain. There are no associated agents to hypertension. There are no known risk factors for coronary artery disease. Treatments tried: lisinopril. The current treatment provides moderate improvement. There are no compliance problems.    Patient needs form for his insurance completed also.  The patient comes in today for a wellness visit.    A review of their health history was completed.  A review of medications was also completed.  Any needed refills; no  Eating habits: Not so good eating habits  Falls/  MVA accidents in past few months: No injuries or accidents  Regular exercise: Not much  Specialist pt sees on regular basis: None  Preventative health issues were discussed.   Additional concerns: Discussion of his elevated cholesterol from his lab work  Sitting BP- 124/86 Standing BP- 128/82  Review of Systems  Constitutional: Negative for fever, activity change and appetite change.  HENT: Negative for congestion and rhinorrhea.   Eyes: Negative for discharge.  Respiratory: Negative for cough and wheezing.   Cardiovascular: Negative for chest pain.  Gastrointestinal: Negative for vomiting, abdominal pain and blood in stool.  Genitourinary: Negative for frequency and difficulty urinating.  Musculoskeletal: Negative for neck pain.  Skin: Negative for rash.  Allergic/Immunologic: Negative for environmental allergies and food allergies.  Neurological: Negative for weakness and headaches.  Psychiatric/Behavioral: Negative for agitation.       Objective:   Physical Exam  Constitutional: He appears well-developed and well-nourished.  HENT:  Head: Normocephalic and  atraumatic.  Right Ear: External ear normal.  Left Ear: External ear normal.  Nose: Nose normal.  Mouth/Throat: Oropharynx is clear and moist.  Eyes: EOM are normal. Pupils are equal, round, and reactive to light.  Neck: Normal range of motion. Neck supple. No thyromegaly present.  Cardiovascular: Normal rate, regular rhythm and normal heart sounds.   No murmur heard. Pulmonary/Chest: Effort normal and breath sounds normal. No respiratory distress. He has no wheezes.  Abdominal: Soft. Bowel sounds are normal. He exhibits no distension and no mass. There is no tenderness.  Genitourinary: Prostate normal.  Musculoskeletal: Normal range of motion. He exhibits no edema.  Lymphadenopathy:    He has no cervical adenopathy.  Neurological: He is alert. He exhibits normal muscle tone.  Skin: Skin is warm and dry. No erythema.  Psychiatric: He has a normal mood and affect. His behavior is normal. Judgment normal.   Patient was recently seen for his blood pressure. Today's visit will be coded as a wellness he will follow-up in 8-12 weeks  Patient was advised to start 81 mg aspirin daily risk and benefits was discussed. Patient was encouraged to stop medicine if any stomach pain Patient did request Viagra to try. Warnings and side effects discussed.      Assessment & Plan:  1. Hyperlipemia We discussed in detail of the high level of cholesterol the patient would like to try working really hard on diet recheck this again in 8-12 weeks - Lipid panel - Basic metabolic panel  2. Well adult exam Adult wellness-complete.wellness physical was conducted today. Importance of diet and exercise were discussed in detail. In addition to this a discussion regarding safety was also covered. We also  reviewed over immunizations and gave recommendations regarding current immunization needed for age. In addition to this additional areas were also touched on including: Preventative health exams needed: Colonoscopy  colonoscopy recommended referral for colonoscopy  Patient was advised yearly wellness exam   3. Essential hypertension HTN- Patient was seen today as part of a visit regarding hypertension. The importance of healthy diet and regular physical activity was discussed. The importance of compliance with medications discussed. Ideal goal is to keep blood pressure low elevated levels certainly below 140/90 when possible. The patient was counseled that keeping blood pressure under control lessen his risk of heart attack, stroke, kidney failure, and early death. The importance of regular follow-ups was discussed with the patient. Low-salt diet such as DASH recommended. Regular physical activity was recommended as well. Patient was advised to keep regular follow-ups.

## 2016-05-30 ENCOUNTER — Encounter: Payer: Self-pay | Admitting: Family Medicine

## 2016-05-30 ENCOUNTER — Other Ambulatory Visit: Payer: Self-pay

## 2016-05-30 DIAGNOSIS — Z1211 Encounter for screening for malignant neoplasm of colon: Secondary | ICD-10-CM

## 2016-05-30 NOTE — Telephone Encounter (Signed)
Called [facility] and gave verbal orders for Sildenafil 20 MG tablet may take 3 pills up to 5 pills before relations necessary do not use more than once per Day #25, 4 refills. Pharmacy member verbalized understanding.

## 2016-05-31 ENCOUNTER — Ambulatory Visit: Payer: BLUE CROSS/BLUE SHIELD | Admitting: Family Medicine

## 2016-09-10 ENCOUNTER — Ambulatory Visit (INDEPENDENT_AMBULATORY_CARE_PROVIDER_SITE_OTHER): Payer: BLUE CROSS/BLUE SHIELD | Admitting: Family Medicine

## 2016-09-10 ENCOUNTER — Encounter: Payer: Self-pay | Admitting: Family Medicine

## 2016-09-10 VITALS — BP 124/82 | Temp 99.1°F | Ht 69.0 in | Wt 189.0 lb

## 2016-09-10 DIAGNOSIS — J019 Acute sinusitis, unspecified: Secondary | ICD-10-CM

## 2016-09-10 DIAGNOSIS — B9689 Other specified bacterial agents as the cause of diseases classified elsewhere: Secondary | ICD-10-CM

## 2016-09-10 MED ORDER — AMOXICILLIN-POT CLAVULANATE 875-125 MG PO TABS: 1.0000 | ORAL_TABLET | Freq: Two times a day (BID) | ORAL | 0 refills | Status: DC

## 2016-09-10 NOTE — Progress Notes (Signed)
   Subjective:    Patient ID: Scott Villarreal, male    DOB: 10/25/1966, 50 y.o.   MRN: [id number]  Sinusitis  This is a new problem. Episode onset: 2 days ago. Associated symptoms include congestion, coughing and headaches. Pertinent negatives include no ear pain. Treatments tried: advil.  Significant head congestion drainage coughing chest congestion denies high fever chills sweats denies wheezing  Review of Systems  Constitutional: Negative for activity change and fever.  HENT: Positive for congestion and rhinorrhea. Negative for ear pain.   Eyes: Negative for discharge.  Respiratory: Positive for cough. Negative for wheezing.   Cardiovascular: Negative for chest pain.  Neurological: Positive for headaches.       Objective:   Physical Exam  Constitutional: He appears well-developed.  HENT:  Head: Normocephalic.  Mouth/Throat: Oropharynx is clear and moist. No oropharyngeal exudate.  Neck: Normal range of motion.  Cardiovascular: Normal rate, regular rhythm and normal heart sounds.   No murmur heard. Pulmonary/Chest: Effort normal and breath sounds normal. He has no wheezes.  Lymphadenopathy:    He has no cervical adenopathy.  Neurological: He exhibits normal muscle tone.  Skin: Skin is warm and dry.  Nursing note and vitals reviewed.         Assessment & Plan:  Viral syndrome Secondary rhinosinusitis Secondary bronchitis Antibiotics prescribed warning signs discussed follow-up if problems.

## 2016-09-18 ENCOUNTER — Telehealth: Payer: Self-pay | Admitting: Family Medicine

## 2016-09-18 MED ORDER — LEVOFLOXACIN 500 MG PO TABS: ORAL_TABLET | ORAL | 0 refills | Status: DC

## 2016-09-18 NOTE — Telephone Encounter (Signed)
Patient seen on 10/102017 for bacterial rhinosinusitis.  He says he pretty much has the same symptoms, especially congestion in chest.  He wants to know if we can try him on another antibiotic?  [facility]

## 2016-09-18 NOTE — Telephone Encounter (Signed)
Spoke with patient's wife and informed her per Dr.Steve Luking- we are sending over Levaquinn 500 MG take 1 tablet by mouth once daily for 10 days to [facility]. Patient's wife verbalized understanding.

## 2016-09-18 NOTE — Telephone Encounter (Signed)
levaquin 500 qd for ten d 

## 2016-09-23 ENCOUNTER — Encounter: Payer: Self-pay | Admitting: Family Medicine

## 2016-09-23 ENCOUNTER — Ambulatory Visit (INDEPENDENT_AMBULATORY_CARE_PROVIDER_SITE_OTHER): Payer: BLUE CROSS/BLUE SHIELD | Admitting: Family Medicine

## 2016-09-23 VITALS — BP 132/80 | Temp 99.1°F | Ht 70.0 in | Wt 187.0 lb

## 2016-09-23 DIAGNOSIS — J209 Acute bronchitis, unspecified: Secondary | ICD-10-CM | POA: Diagnosis not present

## 2016-09-23 MED ORDER — ALBUTEROL SULFATE HFA 108 (90 BASE) MCG/ACT IN AERS: 2.0000 | INHALATION_SPRAY | Freq: Four times a day (QID) | RESPIRATORY_TRACT | 0 refills | Status: DC | PRN

## 2016-09-23 MED ORDER — CLARITHROMYCIN 500 MG PO TABS: 500.0000 mg | ORAL_TABLET | Freq: Two times a day (BID) | ORAL | 0 refills | Status: DC

## 2016-09-23 MED ORDER — BENZONATATE 100 MG PO CAPS: 100.0000 mg | ORAL_CAPSULE | Freq: Four times a day (QID) | ORAL | 0 refills | Status: DC | PRN

## 2016-09-23 NOTE — Progress Notes (Signed)
   Subjective:    Patient ID: Scott Villarreal, male    DOB: [birth year], 50 y.o.   MRN: [id number]  Cough  This is a new problem. Episode onset: 3 weeks. Associated symptoms comments: Cough, wheezing, runny nose, headache, fever, abdominal pain. Treatments tried: augmentin, levaquin.   Drainage and cong bad  Persistent cough   Left lower quadrant pain and ten d  Low gr fever, felt bad Friday  Please see prior notes patient has artery been through courses of antibiotics. Remote history of smoking Laid low this weekend    Review of Systems  Respiratory: Positive for cough.    Recent low-grade fever and achiness positive productive cough    Objective:   Physical Exam  Alert no acute distress talkative H&T mom his congestion lungs positive reactive airways with deep breath/cough no crackles heart regular in rhythm      Assessment & Plan:  Impression subacute bronchitis with element of reactive airways plan Biaxin twice a day 10 days. Tessalon Perles for cough. Albuterol 2 sprays 4 times a day regularly. If persists for another week to 10 days call back and we will schedule chest x-ray

## 2016-10-03 ENCOUNTER — Telehealth: Payer: Self-pay | Admitting: Family Medicine

## 2016-10-03 MED ORDER — DOXYCYCLINE HYCLATE 100 MG PO TABS: 100.0000 mg | ORAL_TABLET | Freq: Two times a day (BID) | ORAL | 0 refills | Status: DC

## 2016-10-03 NOTE — Telephone Encounter (Signed)
Prescription sent electronically to pharmacy. Patient notified and scheduled follow up office visit next week with Dr Lorin PicketScott for recheck

## 2016-10-03 NOTE — Telephone Encounter (Signed)
Patient is breaking out in cold sweats, dizzyness, aches, congestion, phlegm in throat.  He was seen on 09/10/16 for bacterial rhinosinusitis.  Also, he was seen 09/23/16 for subacute bronchitis.  Does he need to be seen again or can we try him on a different medication?   [facility]

## 2016-10-03 NOTE — Telephone Encounter (Signed)
Consult with Dr Brett CanalesSteve: Doxycycline 100 mg one tab BID for 10 days and recheck with Dr Lorin PicketScott next week.

## 2016-10-08 ENCOUNTER — Ambulatory Visit (INDEPENDENT_AMBULATORY_CARE_PROVIDER_SITE_OTHER): Payer: BLUE CROSS/BLUE SHIELD | Admitting: Family Medicine

## 2016-10-08 ENCOUNTER — Encounter: Payer: Self-pay | Admitting: Family Medicine

## 2016-10-08 VITALS — BP 122/82 | Temp 98.4°F | Ht 70.0 in | Wt 189.6 lb

## 2016-10-08 DIAGNOSIS — J019 Acute sinusitis, unspecified: Secondary | ICD-10-CM | POA: Diagnosis not present

## 2016-10-08 DIAGNOSIS — J209 Acute bronchitis, unspecified: Secondary | ICD-10-CM | POA: Diagnosis not present

## 2016-10-08 DIAGNOSIS — B9689 Other specified bacterial agents as the cause of diseases classified elsewhere: Secondary | ICD-10-CM

## 2016-10-08 MED ORDER — FLUTICASONE PROPIONATE 50 MCG/ACT NA SUSP: 2.0000 | Freq: Every day | NASAL | 5 refills | Status: DC

## 2016-10-08 MED ORDER — DOXYCYCLINE HYCLATE 100 MG PO TABS
100.0000 mg | ORAL_TABLET | Freq: Two times a day (BID) | ORAL | 0 refills | Status: DC
Start: 2016-10-08 — End: 2017-03-27

## 2016-10-08 NOTE — Progress Notes (Signed)
   Subjective:    Patient ID: Scott Villarreal, male    DOB: 07/29/1966, 50 y.o.   MRN: [id number]  HPI  Patient arrives for a follow up on sinus infection. Patient states he is doing better on the doxycycline. The patient relates that the initial antibiotic but really doesn't seem cleared up the second antibiotic has helped a lot he still faces a upper airway congestion stuffiness as well as some chest congestion it started in the fall when he was doing some work outside. Denies high fever chills sweats wheezing difficulty breathing Review of Systems  Constitutional: Negative for activity change and fever.  HENT: Positive for congestion and rhinorrhea. Negative for ear pain.   Eyes: Negative for discharge.  Respiratory: Positive for cough. Negative for wheezing.   Cardiovascular: Negative for chest pain.       Objective:   Physical Exam  Constitutional: He appears well-developed.  HENT:  Head: Normocephalic.  Mouth/Throat: Oropharynx is clear and moist. No oropharyngeal exudate.  Neck: Normal range of motion.  Cardiovascular: Normal rate, regular rhythm and normal heart sounds.   No murmur heard. Pulmonary/Chest: Effort normal and breath sounds normal. He has no wheezes.  Lymphadenopathy:    He has no cervical adenopathy.  Neurological: He exhibits normal muscle tone.  Skin: Skin is warm and dry.  Nursing note and vitals reviewed.         Assessment & Plan:  Viral syndrome Secondary rhinosinusitis Antibiotics recommended Supportive measures discuss May need additional round of doxycycline I recommend trying Flonase to see if there is any allergy component in the nostrils Follow-up for other regular health checkups

## 2016-10-15 ENCOUNTER — Telehealth: Payer: Self-pay | Admitting: *Deleted

## 2016-10-15 NOTE — Telephone Encounter (Signed)
Fluticasone propionate spray susp approved from 10/08/16 - 10/08/2017. Reference number [id number]. [facility] notified and pt notified on voicemail.

## 2016-10-17 ENCOUNTER — Telehealth: Payer: Self-pay | Admitting: *Deleted

## 2016-10-17 NOTE — Telephone Encounter (Signed)
Fluticasone propionate spray suspension approved from 10/08/16 - 10/08/2017. Authorization number: [id number]. Pharmacy notified.

## 2016-11-14 ENCOUNTER — Other Ambulatory Visit: Payer: Self-pay | Admitting: Family Medicine

## 2016-12-23 ENCOUNTER — Encounter (HOSPITAL_COMMUNITY): Payer: Self-pay | Admitting: Emergency Medicine

## 2016-12-23 ENCOUNTER — Emergency Department (HOSPITAL_COMMUNITY)
Admission: EM | Admit: 2016-12-23 | Discharge: 2016-12-23 | Disposition: A | Discharge status: Home / Self Care | Payer: BLUE CROSS/BLUE SHIELD | Attending: Emergency Medicine | Admitting: Emergency Medicine

## 2016-12-23 DIAGNOSIS — Z79899 Other long term (current) drug therapy: Secondary | ICD-10-CM | POA: Diagnosis not present

## 2016-12-23 DIAGNOSIS — Z7982 Long term (current) use of aspirin: Secondary | ICD-10-CM | POA: Insufficient documentation

## 2016-12-23 DIAGNOSIS — I10 Essential (primary) hypertension: Secondary | ICD-10-CM | POA: Insufficient documentation

## 2016-12-23 DIAGNOSIS — R42 Dizziness and giddiness: Secondary | ICD-10-CM | POA: Diagnosis not present

## 2016-12-23 DIAGNOSIS — Z87891 Personal history of nicotine dependence: Secondary | ICD-10-CM | POA: Diagnosis not present

## 2016-12-23 LAB — CBC WITH DIFFERENTIAL/PLATELET
BASOS ABS: 0 10*3/uL (ref 0.0–0.1)
BASOS PCT: 0 %
EOS PCT: 2 %
Eosinophils Absolute: 0.1 10*3/uL (ref 0.0–0.7)
HEMATOCRIT: 47.1 % (ref 39.0–52.0)
Hemoglobin: 16.2 g/dL (ref 13.0–17.0)
Lymphocytes Relative: 37 %
Lymphs Abs: 1.8 10*3/uL (ref 0.7–4.0)
MCH: 30.9 pg (ref 26.0–34.0)
MCHC: 34.4 g/dL (ref 30.0–36.0)
MCV: 89.7 fL (ref 78.0–100.0)
MONO ABS: 0.5 10*3/uL (ref 0.1–1.0)
Monocytes Relative: 11 %
Neutro Abs: 2.4 10*3/uL (ref 1.7–7.7)
Neutrophils Relative %: 50 %
Platelets: 260 10*3/uL (ref 150–400)
RBC: 5.25 MIL/uL (ref 4.22–5.81)
RDW: 12.9 % (ref 11.5–15.5)
WBC: 4.8 10*3/uL (ref 4.0–10.5)

## 2016-12-23 LAB — BASIC METABOLIC PANEL
Anion gap: 8 (ref 5–15)
BUN: 16 mg/dL (ref 6–20)
CALCIUM: 9 mg/dL (ref 8.9–10.3)
CO2: 26 mmol/L (ref 22–32)
CREATININE: 1.11 mg/dL (ref 0.61–1.24)
Chloride: 100 mmol/L — ABNORMAL LOW (ref 101–111)
GFR calc Af Amer: >60 mL/min (ref >60)
GLUCOSE: 104 mg/dL — AB (ref 65–99)
Potassium: 4.6 mmol/L (ref 3.5–5.1)
Sodium: 134 mmol/L — ABNORMAL LOW (ref 135–145)

## 2016-12-23 NOTE — ED Provider Notes (Signed)
[facility]PT Provider Note   CSN: [id number] Arrival date & time: 12/23/16  0702     History   Chief Complaint Chief Complaint  Patient presents with  . Dizziness    HPI Scott Villarreal is a 51 y.o. male.  Patient is a 51 year old male with history of hypertension. He presents for evaluation of dizziness and generalized malaise. He reports feeling "cold sweats". This started yesterday morning after waking from sleep. He denies any fevers. He denies any chest pain or difficulty breathing.   The history is provided by the patient.  Dizziness  Quality:  Lightheadedness Severity:  Moderate Onset quality:  Sudden Duration:  1 day Timing:  Constant Progression:  Worsening Chronicity:  New Relieved by:  Nothing Worsened by:  Nothing Ineffective treatments:  None tried Associated symptoms: no chest pain, no palpitations and no shortness of breath     Past Medical History:  Diagnosis Date  . Hypertension     Patient Active Problem List   Diagnosis Date Noted  . Essential hypertension, benign 05/07/2013  . Hypertriglyceridemia 05/07/2013    Past Surgical History:  Procedure Laterality Date  . ELBOW SURGERY Left        Home Medications    Prior to Admission medications   Medication Sig Start Date End Date Taking? Authorizing Provider  albuterol (PROVENTIL HFA;VENTOLIN HFA) 108 (90 Base) MCG/ACT inhaler Inhale 2 puffs into the lungs every 6 (six) hours as needed for wheezing or shortness of breath. 09/23/16   Merlyn AlbertWilliam S Luking, MD  aspirin 81 MG tablet Take 81 mg by mouth daily.    Historical Provider, MD  benzonatate (TESSALON) 100 MG capsule Take 1 capsule (100 mg total) by mouth every 6 (six) hours as needed for cough. 09/23/16   Merlyn AlbertWilliam S Luking, MD  doxycycline (VIBRA-TABS) 100 MG tablet Take 1 tablet (100 mg total) by mouth 2 (two) times daily. 10/08/16   Babs SciaraScott A Luking, MD  fluticasone (FLONASE) 50 MCG/ACT nasal spray Place 2 sprays into both nostrils daily.  10/08/16   Babs SciaraScott A Luking, MD  levofloxacin (LEVAQUIN) 500 MG tablet Take 1 tablet by mouth once daily for 10 days Patient not taking: Reported on 10/08/2016 09/18/16   Merlyn AlbertWilliam S Luking, MD  lisinopril (PRINIVIL,ZESTRIL) 10 MG tablet TAKE ONE (1) TABLET EACH DAY 11/15/16   Babs SciaraScott A Luking, MD  sildenafil (VIAGRA) 100 MG tablet Take 0.5-1 tablets (50-100 mg total) by mouth daily as needed for erectile dysfunction. 05/29/16   Babs SciaraScott A Luking, MD    Family History Family History  Problem Relation Age of Onset  . Hypertension Other     Social History Social History  Substance Use Topics  . Smoking status: Former Smoker    Packs/day: 2.00    Years: 25.00    Types: Cigarettes    Quit date: 08/17/1998  . Smokeless tobacco: Never Used  . Alcohol use 18.6 oz/week    24 Cans of beer, 7 Shots of liquor per week     Allergies   Patient has no known allergies.   Review of Systems Review of Systems  Respiratory: Negative for shortness of breath.   Cardiovascular: Negative for chest pain and palpitations.  Neurological: Positive for dizziness.  All other systems reviewed and are negative.    Physical Exam Updated Vital Signs BP 145/99   Pulse 88   Temp 98.2 F (36.8 C)   Resp 18   Ht 5\' 9"  (1.753 m)   Wt 190 lb (86.2 kg)  SpO2 100%   BMI 28.06 kg/m   Physical Exam  Constitutional: He is oriented to person, place, and time. He appears well-developed and well-nourished. No distress.  HENT:  Head: Normocephalic and atraumatic.  Mouth/Throat: Oropharynx is clear and moist.  Neck: Normal range of motion. Neck supple.  Cardiovascular: Normal rate and regular rhythm.  Exam reveals no friction rub.   No murmur heard. Pulmonary/Chest: Effort normal and breath sounds normal. No respiratory distress. He has no wheezes. He has no rales.  Abdominal: Soft. Bowel sounds are normal. He exhibits no distension. There is no tenderness.  Musculoskeletal: Normal range of motion. He exhibits no  edema.  Neurological: He is alert and oriented to person, place, and time. Coordination normal.  Skin: Skin is warm and dry. He is not diaphoretic.  Nursing note and vitals reviewed.    ED Treatments / Results  Labs (all labs ordered are listed, but only abnormal results are displayed) Labs Reviewed  BASIC METABOLIC PANEL  CBC WITH DIFFERENTIAL/PLATELET    EKG  EKG Interpretation  Date/Time:  Monday December 23 2016 07:14:03 EST Ventricular Rate:  79 PR Interval:    QRS Duration: 100 QT Interval:  355 QTC Calculation: 407 R Axis:     Text Interpretation:  Sinus rhythm Low voltage, precordial leads ST elevation suggests acute pericarditis No previous ECGs available Confirmed by Manus Gunning  MD, STEPHEN ([id number]) on 12/23/2016 7:51:39 AM       Radiology No results found.  Procedures Procedures (including critical care time)  Medications Ordered in ED Medications - No data to display   Initial Impression / Assessment and Plan / ED Course  I have reviewed the triage vital signs and the nursing notes.  Pertinent labs & imaging results that were available during my care of the patient were reviewed by me and considered in my medical decision making (see chart for details).  Patient is a 51 year old male who presents with constitutional complaints of weakness and dizziness that started yesterday. He is afebrile and physical examination is completely normal. He is neurologically intact. Workup reveals a normal EKG and unremarkable laboratory studies. There is no evidence for electrolyte abnormality, anemia, low blood sugar, or other cause of his symptoms. He may well be coming down with some sort of viral infection. I've advised him to give this time, rest, and return as needed if he experiences any new and concerning symptoms.  Final Clinical Impressions(s) / ED Diagnoses   Final diagnoses:  None    New Prescriptions New Prescriptions   No medications on file     Geoffery Lyons, MD 12/23/16 0900

## 2016-12-23 NOTE — ED Triage Notes (Signed)
Pt c/o intermittent cold sweats, dizziness and neck pain since 0900 this am. denies cp/sob/cough/congestion. nad noted.

## 2016-12-23 NOTE — Discharge Instructions (Signed)
Drink plenty of fluids and get plenty of rest.  Turn to the emergency department if you develop chest pain, difficulty breathing, or other new and concerning symptoms.

## 2016-12-23 NOTE — ED Notes (Signed)
ED Provider at bedside. 

## 2017-03-27 ENCOUNTER — Encounter: Payer: Self-pay | Admitting: Family Medicine

## 2017-03-27 ENCOUNTER — Ambulatory Visit (INDEPENDENT_AMBULATORY_CARE_PROVIDER_SITE_OTHER): Payer: BLUE CROSS/BLUE SHIELD | Admitting: Family Medicine

## 2017-03-27 VITALS — BP 132/80 | Temp 97.9°F | Ht 69.0 in | Wt 184.0 lb

## 2017-03-27 DIAGNOSIS — R6889 Other general symptoms and signs: Secondary | ICD-10-CM | POA: Diagnosis not present

## 2017-03-27 DIAGNOSIS — J019 Acute sinusitis, unspecified: Secondary | ICD-10-CM

## 2017-03-27 DIAGNOSIS — R0789 Other chest pain: Secondary | ICD-10-CM | POA: Diagnosis not present

## 2017-03-27 MED ORDER — AMOXICILLIN 500 MG PO TABS: 500.0000 mg | ORAL_TABLET | Freq: Three times a day (TID) | ORAL | 0 refills | Status: DC

## 2017-03-27 MED ORDER — DICLOFENAC SODIUM 75 MG PO TBEC: 75.0000 mg | DELAYED_RELEASE_TABLET | Freq: Two times a day (BID) | ORAL | 0 refills | Status: DC

## 2017-03-27 NOTE — Progress Notes (Signed)
   Subjective:    Patient ID: Scott Villarreal, male    DOB: 10/20/1966, 51 y.o.   MRN: [id number]  Sinusitis  This is a new problem. Episode onset: 2 days. Associated symptoms include chills, congestion, coughing, headaches and a sore throat. Pertinent negatives include no ear pain. (Fever) Treatments tried: advil and cold med.  He relates a relatively quick onset of head congestion drainage coughing sinus pressure body aches low-grade fever sore throat denies wheezing and have some chills Patient also relates some low back discomfort in left mid back discomfort hurts when he lays on that side occurred after he did strenuous push at work denies any other injury.    Review of Systems  Constitutional: Positive for chills, fatigue and fever. Negative for activity change.  HENT: Positive for congestion, rhinorrhea and sore throat. Negative for ear pain.   Eyes: Negative for discharge.  Respiratory: Positive for cough. Negative for wheezing.   Cardiovascular: Negative for chest pain.  Neurological: Positive for headaches.       Objective:   Physical Exam  Constitutional: He appears well-developed.  HENT:  Head: Normocephalic.  Mouth/Throat: Oropharynx is clear and moist. No oropharyngeal exudate.  Neck: Normal range of motion.  Cardiovascular: Normal rate, regular rhythm and normal heart sounds.   No murmur heard. Pulmonary/Chest: Effort normal and breath sounds normal. He has no wheezes.  Lymphadenopathy:    He has no cervical adenopathy.  Neurological: He exhibits normal muscle tone.  Skin: Skin is warm and dry.  Nursing note and vitals reviewed.   Abdomen is completely soft with range of motion exercises with his back he does experience some left mid back pain and discomfort that appears to be musculoskeletal      Assessment & Plan:  Musculoskeletal back pain anti-inflammatory over the next 10 days  Viral syndrome flulike illness should gradually get better  Antibiotics  prescribed warning signs discuss  Patient relates some mild constipation since taking anti-inflammatories if this persists he needs to let us now I recommend a stool softener  If the left side discomfort persists he will need to follow-up

## 2017-05-12 ENCOUNTER — Telehealth: Payer: Self-pay | Admitting: Family Medicine

## 2017-05-12 DIAGNOSIS — Z0000 Encounter for general adult medical examination without abnormal findings: Secondary | ICD-10-CM

## 2017-05-12 NOTE — Telephone Encounter (Signed)
Lipid, liver, metabolic 7, PSA 

## 2017-05-12 NOTE — Telephone Encounter (Signed)
Patient has an appointment on 05/30/17 with Dr. Lorin PicketScott for a physical.  He is requesting orders for labs.

## 2017-05-13 NOTE — Telephone Encounter (Signed)
Left message return call 05/13/17 ( labs ordered)

## 2017-05-14 NOTE — Telephone Encounter (Signed)
Spoke with patient and informed him per Dr.Scott Luking- Labs have been ordered. Patient verbalized understanding.  

## 2017-05-23 DIAGNOSIS — Z0000 Encounter for general adult medical examination without abnormal findings: Secondary | ICD-10-CM | POA: Diagnosis not present

## 2017-05-24 LAB — BASIC METABOLIC PANEL
BUN/Creatinine Ratio: 14 (ref 9–20)
BUN: 14 mg/dL (ref 6–24)
CO2: 24 mmol/L (ref 20–29)
Calcium: 9.1 mg/dL (ref 8.7–10.2)
Chloride: 100 mmol/L (ref 96–106)
Creatinine, Ser: 0.99 mg/dL (ref 0.76–1.27)
GFR calc Af Amer: 101 mL/min/{1.73_m2} (ref >59)
GFR calc non Af Amer: 87 mL/min/{1.73_m2} (ref >59)
GLUCOSE: 99 mg/dL (ref 65–99)
POTASSIUM: 4.6 mmol/L (ref 3.5–5.2)
SODIUM: 137 mmol/L (ref 134–144)

## 2017-05-24 LAB — PSA: PROSTATE SPECIFIC AG, SERUM: 0.4 ng/mL (ref 0.0–4.0)

## 2017-05-24 LAB — LIPID PANEL
Chol/HDL Ratio: 5.3 ratio — ABNORMAL HIGH (ref 0.0–5.0)
Cholesterol, Total: 256 mg/dL — ABNORMAL HIGH (ref 100–199)
HDL: 48 mg/dL (ref >39)
LDL Calculated: 157 mg/dL — ABNORMAL HIGH (ref 0–99)
TRIGLYCERIDES: 256 mg/dL — AB (ref 0–149)
VLDL CHOLESTEROL CAL: 51 mg/dL — AB (ref 5–40)

## 2017-05-24 LAB — HEPATIC FUNCTION PANEL
ALT: 49 IU/L — AB (ref 0–44)
AST: 30 IU/L (ref 0–40)
Albumin: 4.2 g/dL (ref 3.5–5.5)
Alkaline Phosphatase: 52 IU/L (ref 39–117)
Bilirubin Total: 0.5 mg/dL (ref 0.0–1.2)
Bilirubin, Direct: 0.13 mg/dL (ref 0.00–0.40)
Total Protein: 6.6 g/dL (ref 6.0–8.5)

## 2017-05-30 ENCOUNTER — Encounter: Payer: Self-pay | Admitting: Family Medicine

## 2017-05-30 ENCOUNTER — Ambulatory Visit (INDEPENDENT_AMBULATORY_CARE_PROVIDER_SITE_OTHER): Payer: BLUE CROSS/BLUE SHIELD | Admitting: Family Medicine

## 2017-05-30 DIAGNOSIS — I10 Essential (primary) hypertension: Secondary | ICD-10-CM | POA: Diagnosis not present

## 2017-05-30 DIAGNOSIS — E781 Pure hyperglyceridemia: Secondary | ICD-10-CM | POA: Diagnosis not present

## 2017-05-30 DIAGNOSIS — Z0000 Encounter for general adult medical examination without abnormal findings: Secondary | ICD-10-CM

## 2017-05-30 DIAGNOSIS — R740 Nonspecific elevation of levels of transaminase and lactic acid dehydrogenase [LDH]: Secondary | ICD-10-CM | POA: Diagnosis not present

## 2017-05-30 DIAGNOSIS — R7401 Elevation of levels of liver transaminase levels: Secondary | ICD-10-CM

## 2017-05-30 MED ORDER — LISINOPRIL 20 MG PO TABS: ORAL_TABLET | ORAL | 5 refills | Status: DC

## 2017-05-30 NOTE — Progress Notes (Signed)
   Subjective:    Patient ID: Scott Villarreal, male    DOB: 06-13-1966, 51 y.o.   MRN: [id number]  HPI The patient comes in today for a wellness visit.  Patient has not had his colonoscopy. Patient states he's been eating a lot of fast food recently including barbecue Does not smoke  Does drink to 3 beers per day Had recent lab work done which shows elevated triglycerides and cholesterol  A review of their health history was completed.  A review of medications was also completed.  Any needed refills; yes   Eating habits: health conscious  Falls/  MVA accidents in past few months: none  Regular exercise:   Specialist pt sees on regular basis: none  Preventative health issues were discussed.   Additional concerns: dizziness, nausea, sinus pressure, started yesterday.  Patient over the past day has had some intermittent headaches denies high fever chills sweats overall energy level subpar denies unilateral numbness weakness Patient has high blood pressure takes his medicine regular basis does not eat healthy does not do exercise but stays physically active with his work Denies being depressed denies injuries  Review of Systems Relates some intermittent headaches past couple days denies neck pain denies fevers no chest tightness pressure pain no vomiting or diarrhea no rectal bleeding or hematuria    Objective:   Physical Exam Eardrums normal throat is normal neck no masses lungs are clear no crackles respiratory rate normal heart is regular no murmurs abdomen is soft no masses mild obesity skin exam no suspicious lesions extremities no edema prostate normal  Blood pressure recheck best reading 138/98     Assessment & Plan:  Adult wellness-complete.wellness physical was conducted today. Importance of diet and exercise were discussed in detail. In addition to this a discussion regarding safety was also covered. We also reviewed over immunizations and gave recommendations regarding  current immunization needed for age. In addition to this additional areas were also touched on including: Preventative health exams needed: Colonoscopy referral made  Patient was advised yearly wellness exam  HTN-subpar control increase lisinopril 20 mg daily. Follow-up if progressive troubles. His wife will check his blood pressure intermittently over the next several weeks and send us some readings for our review patient was told we would like to see the top number in the 120s to 1:30 in the bottom number in the 70s to low 80s if he has ongoing troubles or worsening issues follow-up otherwise follow-up in a proximally 6 months  Hyperlipidemia patient at increased risk of heart attack statin was advised patient would like to clean up his diet and then recheck this again in a few months time I told the patient if his lab work doesn't show significant improvement he will need to consider starting statin  Slight elevation of liver enzyme patient was counseled the importance of cutting back on alcohol more than likely this is fatty liver will follow-up again on this

## 2017-05-30 NOTE — Patient Instructions (Signed)
DASH Eating Plan DASH stands for "Dietary Approaches to Stop Hypertension." The DASH eating plan is a healthy eating plan that has been shown to reduce high blood pressure (hypertension). It may also reduce your risk for type 2 diabetes, heart disease, and stroke. The DASH eating plan may also help with weight loss. What are tips for following this plan? General guidelines  Avoid eating more than 2,300 mg (milligrams) of salt (sodium) a day. If you have hypertension, you may need to reduce your sodium intake to 1,500 mg a day.  Limit alcohol intake to no more than 1 drink a day for nonpregnant women and 2 drinks a day for men. One drink equals 12 oz of beer, 5 oz of wine, or 1 oz of hard liquor.  Work with your health care provider to maintain a healthy body weight or to lose weight. Ask what an ideal weight is for you.  Get at least 30 minutes of exercise that causes your heart to beat faster (aerobic exercise) most days of the week. Activities may include walking, swimming, or biking.  Work with your health care provider or diet and nutrition specialist (dietitian) to adjust your eating plan to your individual calorie needs. Reading food labels  Check food labels for the amount of sodium per serving. Choose foods with less than 5 percent of the Daily Value of sodium. Generally, foods with less than 300 mg of sodium per serving fit into this eating plan.  To find whole grains, look for the word "whole" as the first word in the ingredient list. Shopping  Buy products labeled as "low-sodium" or "no salt added."  Buy fresh foods. Avoid canned foods and premade or frozen meals. Cooking  Avoid adding salt when cooking. Use salt-free seasonings or herbs instead of table salt or sea salt. Check with your health care provider or pharmacist before using salt substitutes.  Do not fry foods. Cook foods using healthy methods such as baking, boiling, grilling, and broiling instead.  Cook with  heart-healthy oils, such as olive, canola, soybean, or sunflower oil. Meal planning   Eat a balanced diet that includes: ? 5 or more servings of fruits and vegetables each day. At each meal, try to fill half of your plate with fruits and vegetables. ? Up to 6-8 servings of whole grains each day. ? Less than 6 oz of lean meat, poultry, or fish each day. A 3-oz serving of meat is about the same size as a deck of cards. One egg equals 1 oz. ? 2 servings of low-fat dairy each day. ? A serving of nuts, seeds, or beans 5 times each week. ? Heart-healthy fats. Healthy fats called Omega-3 fatty acids are found in foods such as flaxseeds and coldwater fish, like sardines, salmon, and mackerel.  Limit how much you eat of the following: ? Canned or prepackaged foods. ? Food that is high in trans fat, such as fried foods. ? Food that is high in saturated fat, such as fatty meat. ? Sweets, desserts, sugary drinks, and other foods with added sugar. ? Full-fat dairy products.  Do not salt foods before eating.  Try to eat at least 2 vegetarian meals each week.  Eat more home-cooked food and less restaurant, buffet, and fast food.  When eating at a restaurant, ask that your food be prepared with less salt or no salt, if possible. What foods are recommended? The items listed may not be a complete list. Talk with your dietitian about what   dietary choices are best for you. Grains Whole-grain or whole-wheat bread. Whole-grain or whole-wheat pasta. Brown rice. Oatmeal. Quinoa. Bulgur. Whole-grain and low-sodium cereals. Pita bread. Low-fat, low-sodium crackers. Whole-wheat flour tortillas. Vegetables Fresh or frozen vegetables (raw, steamed, roasted, or grilled). Low-sodium or reduced-sodium tomato and vegetable juice. Low-sodium or reduced-sodium tomato sauce and tomato paste. Low-sodium or reduced-sodium canned vegetables. Fruits All fresh, dried, or frozen fruit. Canned fruit in natural juice (without  added sugar). Meat and other protein foods Skinless chicken or turkey. Ground chicken or turkey. Pork with fat trimmed off. Fish and seafood. Egg whites. Dried beans, peas, or lentils. Unsalted nuts, nut butters, and seeds. Unsalted canned beans. Lean cuts of beef with fat trimmed off. Low-sodium, lean deli meat. Dairy Low-fat (1%) or fat-free (skim) milk. Fat-free, low-fat, or reduced-fat cheeses. Nonfat, low-sodium ricotta or cottage cheese. Low-fat or nonfat yogurt. Low-fat, low-sodium cheese. Fats and oils Soft margarine without trans fats. Vegetable oil. Low-fat, reduced-fat, or light mayonnaise and salad dressings (reduced-sodium). Canola, safflower, olive, soybean, and sunflower oils. Avocado. Seasoning and other foods Herbs. Spices. Seasoning mixes without salt. Unsalted popcorn and pretzels. Fat-free sweets. What foods are not recommended? The items listed may not be a complete list. Talk with your dietitian about what dietary choices are best for you. Grains Baked goods made with fat, such as croissants, muffins, or some breads. Dry pasta or rice meal packs. Vegetables Creamed or fried vegetables. Vegetables in a cheese sauce. Regular canned vegetables (not low-sodium or reduced-sodium). Regular canned tomato sauce and paste (not low-sodium or reduced-sodium). Regular tomato and vegetable juice (not low-sodium or reduced-sodium). Pickles. Olives. Fruits Canned fruit in a light or heavy syrup. Fried fruit. Fruit in cream or butter sauce. Meat and other protein foods Fatty cuts of meat. Ribs. Fried meat. Bacon. Sausage. Bologna and other processed lunch meats. Salami. Fatback. Hotdogs. Bratwurst. Salted nuts and seeds. Canned beans with added salt. Canned or smoked fish. Whole eggs or egg yolks. Chicken or turkey with skin. Dairy Whole or 2% milk, cream, and half-and-half. Whole or full-fat cream cheese. Whole-fat or sweetened yogurt. Full-fat cheese. Nondairy creamers. Whipped toppings.  Processed cheese and cheese spreads. Fats and oils Butter. Stick margarine. Lard. Shortening. Ghee. Bacon fat. Tropical oils, such as coconut, palm kernel, or palm oil. Seasoning and other foods Salted popcorn and pretzels. Onion salt, garlic salt, seasoned salt, table salt, and sea salt. Worcestershire sauce. Tartar sauce. Barbecue sauce. Teriyaki sauce. Soy sauce, including reduced-sodium. Steak sauce. Canned and packaged gravies. Fish sauce. Oyster sauce. Cocktail sauce. Horseradish that you find on the shelf. Ketchup. Mustard. Meat flavorings and tenderizers. Bouillon cubes. Hot sauce and Tabasco sauce. Premade or packaged marinades. Premade or packaged taco seasonings. Relishes. Regular salad dressings. Where to find more information:  National Heart, Lung, and Blood Institute: www.nhlbi.nih.gov  American Heart Association: www.heart.org Summary  The DASH eating plan is a healthy eating plan that has been shown to reduce high blood pressure (hypertension). It may also reduce your risk for type 2 diabetes, heart disease, and stroke.  With the DASH eating plan, you should limit salt (sodium) intake to 2,300 mg a day. If you have hypertension, you may need to reduce your sodium intake to 1,500 mg a day.  When on the DASH eating plan, aim to eat more fresh fruits and vegetables, whole grains, lean proteins, low-fat dairy, and heart-healthy fats.  Work with your health care provider or diet and nutrition specialist (dietitian) to adjust your eating plan to your individual   calorie needs. This information is not intended to replace advice given to you by your health care provider. Make sure you discuss any questions you have with your health care provider. Document Released: 11/07/2011 Document Revised: 11/11/2016 Document Reviewed: 11/11/2016 Elsevier Interactive Patient Education  2017 [employer]  

## 2017-06-25 DIAGNOSIS — L404 Guttate psoriasis: Secondary | ICD-10-CM | POA: Diagnosis not present

## 2017-08-19 ENCOUNTER — Other Ambulatory Visit: Payer: Self-pay | Admitting: *Deleted

## 2017-08-19 MED ORDER — LISINOPRIL 20 MG PO TABS: ORAL_TABLET | ORAL | 0 refills | Status: DC

## 2017-09-08 ENCOUNTER — Ambulatory Visit (INDEPENDENT_AMBULATORY_CARE_PROVIDER_SITE_OTHER): Payer: BLUE CROSS/BLUE SHIELD | Admitting: Family Medicine

## 2017-09-08 ENCOUNTER — Encounter: Payer: Self-pay | Admitting: Family Medicine

## 2017-09-08 VITALS — BP 148/94 | Temp 98.8°F | Ht 68.0 in | Wt 186.0 lb

## 2017-09-08 DIAGNOSIS — B338 Other specified viral diseases: Secondary | ICD-10-CM

## 2017-09-08 DIAGNOSIS — J019 Acute sinusitis, unspecified: Secondary | ICD-10-CM | POA: Diagnosis not present

## 2017-09-08 DIAGNOSIS — B348 Other viral infections of unspecified site: Secondary | ICD-10-CM

## 2017-09-08 MED ORDER — AZITHROMYCIN 250 MG PO TABS: ORAL_TABLET | ORAL | 0 refills | Status: DC

## 2017-09-08 NOTE — Progress Notes (Signed)
   Subjective:    Patient ID: Scott Villarreal, male    DOB: 07/05/1966, 51 y.o.   MRN: [id number]  Sinusitis  This is a new problem. Episode onset: oct 6th. Associated symptoms include congestion, coughing, ear pain, headaches and a sore throat. (Fever, vomiting) Past treatments include acetaminophen (advil, sudafed).   Patient relates this hit him on Saturday with significant congestion chills sweats not feeling well body aches in addition to that low energy low appetite able to eat and drink relates now a lot of head congestion drainage and coughing denies high fever chills He relates this illness feels more than a common cold. He does not feel short of breath.  Review of Systems  Constitutional: Negative for activity change and fever.  HENT: Positive for congestion, ear pain, rhinorrhea and sore throat.   Eyes: Negative for discharge.  Respiratory: Positive for cough. Negative for wheezing.   Cardiovascular: Negative for chest pain.  Neurological: Positive for headaches.       Objective:   Physical Exam  Constitutional: He appears well-developed.  HENT:  Head: Normocephalic.  Mouth/Throat: Oropharynx is clear and moist. No oropharyngeal exudate.  Neck: Normal range of motion.  Cardiovascular: Normal rate, regular rhythm and normal heart sounds.   No murmur heard. Pulmonary/Chest: Effort normal and breath sounds normal. He has no wheezes.  Lymphadenopathy:    He has no cervical adenopathy.  Neurological: He exhibits normal muscle tone.  Skin: Skin is warm and dry.  Nursing note and vitals reviewed.  He does have sinus congestion discolored drainage as well as upper airway congestion and coughing but no wheezing no difficulty breathing  Warning signs were discussed with him he is to follow-up here or ER for     Assessment & Plan:  Appears to have parainfluenza illness-will more than likely feel bad for the next several days but then gradually turning the corner as the week goes  on if increased congestion fever chills or worse to follow-up  Secondary rhinosinusitis azithromycin should help prevent this from hitting into a more serious condition

## 2017-09-10 ENCOUNTER — Encounter: Payer: Self-pay | Admitting: Family Medicine

## 2017-09-10 ENCOUNTER — Telehealth: Payer: Self-pay | Admitting: Family Medicine

## 2017-09-10 NOTE — Telephone Encounter (Signed)
Pt called stating that he is not fully better and is requesting an extension on his work note to return Monday.

## 2017-09-10 NOTE — Telephone Encounter (Signed)
Pt's wife called to check on this to see if his work note will be extended. Pt stated this morning that he didn't feel like he needed to be seen again that a couple more days out would be enough. Please advise.

## 2017-09-10 NOTE — Telephone Encounter (Signed)
Work excuse complete, notified patient. °

## 2017-09-10 NOTE — Telephone Encounter (Signed)
I called and left a message to r/c if he still needs our assistance.

## 2017-09-10 NOTE — Telephone Encounter (Signed)
He may have a work excuse through Sunday

## 2017-09-10 NOTE — Telephone Encounter (Signed)
Calling to check on this message.

## 2017-09-10 NOTE — Telephone Encounter (Signed)
Actually the front can do this work note, he may have a work excuse through Sunday

## 2017-11-13 ENCOUNTER — Ambulatory Visit: Payer: BLUE CROSS/BLUE SHIELD | Admitting: Family Medicine

## 2017-12-20 ENCOUNTER — Other Ambulatory Visit: Payer: Self-pay | Admitting: Family Medicine

## 2018-01-16 ENCOUNTER — Encounter: Payer: Self-pay | Admitting: Family Medicine

## 2018-01-16 ENCOUNTER — Ambulatory Visit: Payer: BLUE CROSS/BLUE SHIELD | Admitting: Family Medicine

## 2018-01-16 VITALS — BP 132/90 | Temp 98.8°F | Ht 68.0 in | Wt 190.0 lb

## 2018-01-16 DIAGNOSIS — B9689 Other specified bacterial agents as the cause of diseases classified elsewhere: Secondary | ICD-10-CM | POA: Diagnosis not present

## 2018-01-16 DIAGNOSIS — J019 Acute sinusitis, unspecified: Secondary | ICD-10-CM

## 2018-01-16 MED ORDER — LISINOPRIL 20 MG PO TABS: ORAL_TABLET | ORAL | 3 refills | Status: DC

## 2018-01-16 MED ORDER — AMOXICILLIN 500 MG PO TABS: 500.0000 mg | ORAL_TABLET | Freq: Three times a day (TID) | ORAL | 0 refills | Status: DC

## 2018-01-16 NOTE — Progress Notes (Signed)
   Subjective:    Patient ID: Scott NancyEdward Dettman, male    DOB: 1966/01/16, 52 y.o.   MRN: [id number]  Cough  This is a new problem. Episode onset: 3 weeks. Associated symptoms include rhinorrhea. Pertinent negatives include no chest pain, chills, ear pain, fever or wheezing. Associated symptoms comments: Vomiting in the mornings, reflux. He has tried nothing for the symptoms.   Needs refill on lisinopril. Only has two pills left.  Denies wheezing fever chills sweats denies chest tightness pressure pain  Review of Systems  Constitutional: Negative for activity change, chills and fever.  HENT: Positive for congestion and rhinorrhea. Negative for ear pain.   Eyes: Negative for discharge.  Respiratory: Positive for cough. Negative for wheezing.   Cardiovascular: Negative for chest pain.  Gastrointestinal: Negative for nausea and vomiting.  Musculoskeletal: Negative for arthralgias.       Objective:   Physical Exam  Constitutional: He appears well-developed.  HENT:  Head: Normocephalic and atraumatic.  Mouth/Throat: Oropharynx is clear and moist. No oropharyngeal exudate.  Eyes: Right eye exhibits no discharge. Left eye exhibits no discharge.  Neck: Normal range of motion.  Cardiovascular: Normal rate, regular rhythm and normal heart sounds.  No murmur heard. Pulmonary/Chest: Effort normal and breath sounds normal. No respiratory distress. He has no wheezes. He has no rales.  Lymphadenopathy:    He has no cervical adenopathy.  Neurological: He exhibits normal muscle tone.  Skin: Skin is warm and dry.  Nursing note and vitals reviewed.         Assessment & Plan:  Patient was seen today for upper respiratory illness. It is felt that the patient is dealing with sinusitis. Antibiotics were prescribed today. Importance of compliance with medication was discussed. Symptoms should gradually resolve over the course of the next several days. If high fevers, progressive illness, difficulty  breathing, worsening condition or failure for symptoms to improve over the next several days then the patient is to follow-up. If any emergent conditions the patient is to follow-up in the emergency department otherwise to follow-up in the office.  Should gradually get better over the next 2 weeks if laryngitis persists I recommend referral to ENT to visualize vocal cords the patient is aware of this  Refills of blood pressure medicine given but he does need to do a comprehensive follow-up

## 2018-01-23 DIAGNOSIS — S4991XA Unspecified injury of right shoulder and upper arm, initial encounter: Secondary | ICD-10-CM | POA: Diagnosis not present

## 2018-05-19 ENCOUNTER — Encounter: Payer: Self-pay | Admitting: Family Medicine

## 2018-05-19 ENCOUNTER — Ambulatory Visit (INDEPENDENT_AMBULATORY_CARE_PROVIDER_SITE_OTHER): Payer: BLUE CROSS/BLUE SHIELD | Admitting: Family Medicine

## 2018-05-19 VITALS — BP 140/90 | Ht 68.0 in | Wt 191.6 lb

## 2018-05-19 DIAGNOSIS — M25521 Pain in right elbow: Secondary | ICD-10-CM

## 2018-05-19 DIAGNOSIS — M25511 Pain in right shoulder: Secondary | ICD-10-CM

## 2018-05-19 MED ORDER — ETODOLAC 400 MG PO TABS: 400.0000 mg | ORAL_TABLET | Freq: Two times a day (BID) | ORAL | 0 refills | Status: DC

## 2018-05-19 NOTE — Progress Notes (Signed)
   Subjective:    Patient ID: Scott Villarreal, male    DOB: [birth year], 52 y.o.   MRN: [id number]  HPI Patient arrives with right shoulder pain for 3-4 weeks-getting worse over the last week.  Pt notes right shoulder pain substantial  Throbs and aches  With pressure on it it calms down   lifts a fair amnt thru the day   Has painful shoulder  Right handed  Has tried reg aspirin, took some aleave helps a lot     occas poppping no major injury in the past    Doing repetetive work with right shoulder at work   Review of Systems No headache, no major weight loss or weight gain, no chest pain no back pain abdominal pain no change in bowel habits complete ROS otherwise negative     Objective:   Physical Exam Alert vitals stable, NAD. Blood pressure good on repeat. HEENT normal. Lungs clear. Heart regular rate and rhythm. Right shoulder good range of motion.  Mild impingement sign.  Anterior shoulder pain to palpation.  No abnormalities.  Strength intact.  Impression right shoulder strain.  Worsened by repetitive activity at work.  Trial of anti-inflammatory medicine.  Codman's exercises discussed.  X-ray right shoulder rationale discussed       Assessment & Plan:

## 2018-05-20 ENCOUNTER — Ambulatory Visit (HOSPITAL_COMMUNITY)
Admission: RE | Admit: 2018-05-20 | Discharge: 2018-05-20 | Disposition: A | Discharge status: Home / Self Care | Payer: Self-pay | Source: Ambulatory Visit | Attending: Family Medicine | Admitting: Family Medicine

## 2018-05-20 DIAGNOSIS — M25521 Pain in right elbow: Secondary | ICD-10-CM | POA: Insufficient documentation

## 2018-05-21 ENCOUNTER — Encounter: Payer: Self-pay | Admitting: Family Medicine

## 2018-05-25 ENCOUNTER — Telehealth: Payer: Self-pay | Admitting: Family Medicine

## 2018-05-25 ENCOUNTER — Other Ambulatory Visit: Payer: Self-pay | Admitting: Family Medicine

## 2018-05-25 DIAGNOSIS — E781 Pure hyperglyceridemia: Secondary | ICD-10-CM

## 2018-05-25 DIAGNOSIS — I10 Essential (primary) hypertension: Secondary | ICD-10-CM

## 2018-05-25 DIAGNOSIS — E785 Hyperlipidemia, unspecified: Secondary | ICD-10-CM

## 2018-05-25 DIAGNOSIS — Z125 Encounter for screening for malignant neoplasm of prostate: Secondary | ICD-10-CM

## 2018-05-25 DIAGNOSIS — Z114 Encounter for screening for human immunodeficiency virus [HIV]: Secondary | ICD-10-CM

## 2018-05-25 NOTE — Telephone Encounter (Signed)
Patient has an appointment on 05/28/18 with Dr. Scott.  He is requesting orders for labs. °

## 2018-05-25 NOTE — Telephone Encounter (Signed)
Last labs 05/2017-Lipid, Liver and Met 7

## 2018-05-25 NOTE — Telephone Encounter (Signed)
HIV antibody, lipid, liver, metabolic 7, PSA

## 2018-05-26 NOTE — Telephone Encounter (Signed)
Blood work ordered in Epic. Patient notified. 

## 2018-05-26 NOTE — Telephone Encounter (Signed)
May have 1 refill needs follow-up office visit within the next month

## 2018-05-27 DIAGNOSIS — Z125 Encounter for screening for malignant neoplasm of prostate: Secondary | ICD-10-CM | POA: Diagnosis not present

## 2018-05-27 DIAGNOSIS — E785 Hyperlipidemia, unspecified: Secondary | ICD-10-CM | POA: Diagnosis not present

## 2018-05-27 DIAGNOSIS — Z114 Encounter for screening for human immunodeficiency virus [HIV]: Secondary | ICD-10-CM | POA: Diagnosis not present

## 2018-05-27 DIAGNOSIS — I10 Essential (primary) hypertension: Secondary | ICD-10-CM | POA: Diagnosis not present

## 2018-05-27 DIAGNOSIS — E781 Pure hyperglyceridemia: Secondary | ICD-10-CM | POA: Diagnosis not present

## 2018-05-28 ENCOUNTER — Telehealth: Payer: Self-pay | Admitting: *Deleted

## 2018-05-28 ENCOUNTER — Ambulatory Visit (INDEPENDENT_AMBULATORY_CARE_PROVIDER_SITE_OTHER): Payer: BLUE CROSS/BLUE SHIELD | Admitting: Family Medicine

## 2018-05-28 ENCOUNTER — Encounter: Payer: Self-pay | Admitting: Family Medicine

## 2018-05-28 VITALS — BP 132/84 | Ht 68.0 in | Wt 189.0 lb

## 2018-05-28 DIAGNOSIS — Z0001 Encounter for general adult medical examination with abnormal findings: Secondary | ICD-10-CM | POA: Diagnosis not present

## 2018-05-28 DIAGNOSIS — R748 Abnormal levels of other serum enzymes: Secondary | ICD-10-CM | POA: Diagnosis not present

## 2018-05-28 DIAGNOSIS — I10 Essential (primary) hypertension: Secondary | ICD-10-CM

## 2018-05-28 DIAGNOSIS — E7849 Other hyperlipidemia: Secondary | ICD-10-CM

## 2018-05-28 DIAGNOSIS — Z1211 Encounter for screening for malignant neoplasm of colon: Secondary | ICD-10-CM

## 2018-05-28 DIAGNOSIS — Z0000 Encounter for general adult medical examination without abnormal findings: Secondary | ICD-10-CM

## 2018-05-28 LAB — HEPATIC FUNCTION PANEL
ALT: 47 IU/L — ABNORMAL HIGH (ref 0–44)
AST: 26 IU/L (ref 0–40)
Albumin: 4.4 g/dL (ref 3.5–5.5)
Alkaline Phosphatase: 50 IU/L (ref 39–117)
BILIRUBIN TOTAL: 0.3 mg/dL (ref 0.0–1.2)
BILIRUBIN, DIRECT: 0.1 mg/dL (ref 0.00–0.40)
TOTAL PROTEIN: 7 g/dL (ref 6.0–8.5)

## 2018-05-28 LAB — PSA: Prostate Specific Ag, Serum: 1 ng/mL (ref 0.0–4.0)

## 2018-05-28 LAB — BASIC METABOLIC PANEL
BUN/Creatinine Ratio: 15 (ref 9–20)
BUN: 19 mg/dL (ref 6–24)
CALCIUM: 9.2 mg/dL (ref 8.7–10.2)
CHLORIDE: 103 mmol/L (ref 96–106)
CO2: 23 mmol/L (ref 20–29)
Creatinine, Ser: 1.23 mg/dL (ref 0.76–1.27)
GFR calc Af Amer: 77 mL/min/{1.73_m2} (ref >59)
GFR calc non Af Amer: 67 mL/min/{1.73_m2} (ref >59)
GLUCOSE: 103 mg/dL — AB (ref 65–99)
POTASSIUM: 4.9 mmol/L (ref 3.5–5.2)
Sodium: 142 mmol/L (ref 134–144)

## 2018-05-28 LAB — LIPID PANEL
CHOL/HDL RATIO: 6.5 ratio — AB (ref 0.0–5.0)
Cholesterol, Total: 304 mg/dL — ABNORMAL HIGH (ref 100–199)
HDL: 47 mg/dL (ref >39)
LDL CALC: 186 mg/dL — AB (ref 0–99)
Triglycerides: 355 mg/dL — ABNORMAL HIGH (ref 0–149)
VLDL Cholesterol Cal: 71 mg/dL — ABNORMAL HIGH (ref 5–40)

## 2018-05-28 LAB — HIV ANTIBODY (ROUTINE TESTING W REFLEX): HIV Screen 4th Generation wRfx: NONREACTIVE

## 2018-05-28 MED ORDER — LISINOPRIL 20 MG PO TABS: ORAL_TABLET | ORAL | 5 refills | Status: DC

## 2018-05-28 MED ORDER — ROSUVASTATIN CALCIUM 10 MG PO TABS: 10.0000 mg | ORAL_TABLET | Freq: Every day | ORAL | 5 refills | Status: DC

## 2018-05-28 NOTE — Telephone Encounter (Signed)
Pt wants to see dr Karilyn Cotarehman for colonoscopy.

## 2018-05-28 NOTE — Progress Notes (Signed)
Subjective:    Patient ID: Scott Villarreal, male    DOB: 01-18-1966, 52 y.o.   MRN: [id number]  HPI The patient comes in today for a wellness visit.    A review of their health history was completed.  A review of medications was also completed.  Any needed refills; update all meds  Eating habits:   Falls/  MVA accidents in past few months: none  Regular exercise: very active at work  Specialist pt sees on regular basis: none  Preventative health issues were discussed.   Additional concerns: none HTN- Patient was seen today as part of a visit regarding hypertension. The importance of healthy diet and regular physical activity was discussed. The importance of compliance with medications discussed.  Ideal goal is to keep blood pressure low elevated levels certainly below 140/90 when possible.  The patient was counseled that keeping blood pressure under control lessen his risk of complications.  The importance of regular follow-ups was discussed with the patient.  Low-salt diet such as DASH recommended.  Regular physical activity was recommended as well.  Patient was advised to keep regular follow-ups.  Elevated liver enzyme patient does consume 2-3 drinks per day patient was counseled to cut back on this try to cut it out during the week and only use it on the weekend  Patient also with severe hyperlipidemia at a 10% risk of heart disease recommend statins we discussed potential side effects patient agrees to try the medication     Review of Systems  Constitutional: Negative for activity change, appetite change and fever.  HENT: Negative for congestion and rhinorrhea.   Eyes: Negative for discharge.  Respiratory: Negative for cough and wheezing.   Cardiovascular: Negative for chest pain.  Gastrointestinal: Negative for abdominal pain, blood in stool and vomiting.  Genitourinary: Negative for difficulty urinating and frequency.  Musculoskeletal: Negative for neck pain.  Skin:  Negative for rash.  Allergic/Immunologic: Negative for environmental allergies and food allergies.  Neurological: Negative for weakness and headaches.  Psychiatric/Behavioral: Negative for agitation.       Objective:   Physical Exam  Constitutional: He appears well-developed and well-nourished.  HENT:  Head: Normocephalic and atraumatic.  Right Ear: External ear normal.  Left Ear: External ear normal.  Nose: Nose normal.  Mouth/Throat: Oropharynx is clear and moist.  Eyes: Pupils are equal, round, and reactive to light. EOM are normal.  Neck: Normal range of motion. Neck supple. No thyromegaly present.  Cardiovascular: Normal rate, regular rhythm and normal heart sounds.  No murmur heard. Pulmonary/Chest: Effort normal and breath sounds normal. No respiratory distress. He has no wheezes.  Abdominal: Soft. Bowel sounds are normal. He exhibits no distension and no mass. There is no tenderness.  Genitourinary: Penis normal.  Musculoskeletal: Normal range of motion. He exhibits no edema.  Lymphadenopathy:    He has no cervical adenopathy.  Neurological: He is alert. He exhibits normal muscle tone.  Skin: Skin is warm and dry. No erythema.  Psychiatric: He has a normal mood and affect. His behavior is normal. Judgment normal.          Assessment & Plan:  Adult wellness-complete.wellness physical was conducted today. Importance of diet and exercise were discussed in detail.  In addition to this a discussion regarding safety was also covered. We also reviewed over immunizations and gave recommendations regarding current immunization needed for age.  In addition to this additional areas were also touched on including: Preventative health exams needed:  Colonoscopy referral for  colonoscopy  Patient was advised yearly wellness exam  Severe hyperlipidemia start statins check lab work in 8 to 12 weeks patient to follow-up  HTN fair control watch diet stay active medications  given  Elevated liver enzyme related to fatty liver as well as alcohol use cut back on alcohol use recheck lab work in 8 to 12 weeks

## 2018-06-10 ENCOUNTER — Encounter: Payer: Self-pay | Admitting: Family Medicine

## 2018-06-10 ENCOUNTER — Encounter (INDEPENDENT_AMBULATORY_CARE_PROVIDER_SITE_OTHER): Payer: Self-pay | Admitting: *Deleted

## 2018-06-10 ENCOUNTER — Encounter (INDEPENDENT_AMBULATORY_CARE_PROVIDER_SITE_OTHER): Payer: Self-pay

## 2018-08-28 ENCOUNTER — Ambulatory Visit: Payer: BLUE CROSS/BLUE SHIELD | Admitting: Family Medicine

## 2018-09-10 DIAGNOSIS — E7849 Other hyperlipidemia: Secondary | ICD-10-CM | POA: Diagnosis not present

## 2018-09-10 DIAGNOSIS — Z0000 Encounter for general adult medical examination without abnormal findings: Secondary | ICD-10-CM | POA: Diagnosis not present

## 2018-09-11 LAB — LIPID PANEL
CHOL/HDL RATIO: 6.1 ratio — AB (ref 0.0–5.0)
Cholesterol, Total: 275 mg/dL — ABNORMAL HIGH (ref 100–199)
HDL: 45 mg/dL (ref >39)
LDL CALC: 174 mg/dL — AB (ref 0–99)
Triglycerides: 280 mg/dL — ABNORMAL HIGH (ref 0–149)
VLDL CHOLESTEROL CAL: 56 mg/dL — AB (ref 5–40)

## 2018-09-11 LAB — HEPATIC FUNCTION PANEL
ALK PHOS: 51 IU/L (ref 39–117)
ALT: 35 IU/L (ref 0–44)
AST: 21 IU/L (ref 0–40)
Albumin: 4.3 g/dL (ref 3.5–5.5)
Bilirubin Total: 0.4 mg/dL (ref 0.0–1.2)
Bilirubin, Direct: 0.11 mg/dL (ref 0.00–0.40)
TOTAL PROTEIN: 6.8 g/dL (ref 6.0–8.5)

## 2018-09-23 ENCOUNTER — Encounter: Payer: Self-pay | Admitting: Family Medicine

## 2018-09-23 ENCOUNTER — Ambulatory Visit: Payer: BLUE CROSS/BLUE SHIELD | Admitting: Family Medicine

## 2018-09-23 VITALS — BP 138/84 | Ht 68.0 in | Wt 190.8 lb

## 2018-09-23 DIAGNOSIS — I10 Essential (primary) hypertension: Secondary | ICD-10-CM

## 2018-09-23 DIAGNOSIS — E7849 Other hyperlipidemia: Secondary | ICD-10-CM | POA: Diagnosis not present

## 2018-09-23 DIAGNOSIS — Z1211 Encounter for screening for malignant neoplasm of colon: Secondary | ICD-10-CM

## 2018-09-23 MED ORDER — PRAVASTATIN SODIUM 20 MG PO TABS: 20.0000 mg | ORAL_TABLET | Freq: Every evening | ORAL | 5 refills | Status: DC

## 2018-09-23 MED ORDER — LISINOPRIL 20 MG PO TABS: ORAL_TABLET | ORAL | 5 refills | Status: DC

## 2018-09-23 NOTE — Patient Instructions (Addendum)
DASH Eating Plan DASH stands for "Dietary Approaches to Stop Hypertension." The DASH eating plan is a healthy eating plan that has been shown to reduce high blood pressure (hypertension). It may also reduce your risk for type 2 diabetes, heart disease, and stroke. The DASH eating plan may also help with weight loss. What are tips for following this plan? General guidelines  Avoid eating more than 2,300 mg (milligrams) of salt (sodium) a day. If you have hypertension, you may need to reduce your sodium intake to 1,500 mg a day.  Limit alcohol intake to no more than 1 drink a day for nonpregnant women and 2 drinks a day for men. One drink equals 12 oz of beer, 5 oz of wine, or 1 oz of hard liquor.  Work with your health care provider to maintain a healthy body weight or to lose weight. Ask what an ideal weight is for you.  Get at least 30 minutes of exercise that causes your heart to beat faster (aerobic exercise) most days of the week. Activities may include walking, swimming, or biking.  Work with your health care provider or diet and nutrition specialist (dietitian) to adjust your eating plan to your individual calorie needs. Reading food labels  Check food labels for the amount of sodium per serving. Choose foods with less than 5 percent of the Daily Value of sodium. Generally, foods with less than 300 mg of sodium per serving fit into this eating plan.  To find whole grains, look for the word "whole" as the first word in the ingredient list. Shopping  Buy products labeled as "low-sodium" or "no salt added."  Buy fresh foods. Avoid canned foods and premade or frozen meals. Cooking  Avoid adding salt when cooking. Use salt-free seasonings or herbs instead of table salt or sea salt. Check with your health care provider or pharmacist before using salt substitutes.  Do not fry foods. Cook foods using healthy methods such as baking, boiling, grilling, and broiling instead.  Cook with  heart-healthy oils, such as olive, canola, soybean, or sunflower oil. Meal planning   Eat a balanced diet that includes: ? 5 or more servings of fruits and vegetables each day. At each meal, try to fill half of your plate with fruits and vegetables. ? Up to 6-8 servings of whole grains each day. ? Less than 6 oz of lean meat, poultry, or fish each day. A 3-oz serving of meat is about the same size as a deck of cards. One egg equals 1 oz. ? 2 servings of low-fat dairy each day. ? A serving of nuts, seeds, or beans 5 times each week. ? Heart-healthy fats. Healthy fats called Omega-3 fatty acids are found in foods such as flaxseeds and coldwater fish, like sardines, salmon, and mackerel.  Limit how much you eat of the following: ? Canned or prepackaged foods. ? Food that is high in trans fat, such as fried foods. ? Food that is high in saturated fat, such as fatty meat. ? Sweets, desserts, sugary drinks, and other foods with added sugar. ? Full-fat dairy products.  Do not salt foods before eating.  Try to eat at least 2 vegetarian meals each week.  Eat more home-cooked food and less restaurant, buffet, and fast food.  When eating at a restaurant, ask that your food be prepared with less salt or no salt, if possible. What foods are recommended? The items listed may not be a complete list. Talk with your dietitian about what   dietary choices are best for you. Grains Whole-grain or whole-wheat bread. Whole-grain or whole-wheat pasta. Brown rice. Oatmeal. Quinoa. Bulgur. Whole-grain and low-sodium cereals. Pita bread. Low-fat, low-sodium crackers. Whole-wheat flour tortillas. Vegetables Fresh or frozen vegetables (raw, steamed, roasted, or grilled). Low-sodium or reduced-sodium tomato and vegetable juice. Low-sodium or reduced-sodium tomato sauce and tomato paste. Low-sodium or reduced-sodium canned vegetables. Fruits All fresh, dried, or frozen fruit. Canned fruit in natural juice (without  added sugar). Meat and other protein foods Skinless chicken or turkey. Ground chicken or turkey. Pork with fat trimmed off. Fish and seafood. Egg whites. Dried beans, peas, or lentils. Unsalted nuts, nut butters, and seeds. Unsalted canned beans. Lean cuts of beef with fat trimmed off. Low-sodium, lean deli meat. Dairy Low-fat (1%) or fat-free (skim) milk. Fat-free, low-fat, or reduced-fat cheeses. Nonfat, low-sodium ricotta or cottage cheese. Low-fat or nonfat yogurt. Low-fat, low-sodium cheese. Fats and oils Soft margarine without trans fats. Vegetable oil. Low-fat, reduced-fat, or light mayonnaise and salad dressings (reduced-sodium). Canola, safflower, olive, soybean, and sunflower oils. Avocado. Seasoning and other foods Herbs. Spices. Seasoning mixes without salt. Unsalted popcorn and pretzels. Fat-free sweets. What foods are not recommended? The items listed may not be a complete list. Talk with your dietitian about what dietary choices are best for you. Grains Baked goods made with fat, such as croissants, muffins, or some breads. Dry pasta or rice meal packs. Vegetables Creamed or fried vegetables. Vegetables in a cheese sauce. Regular canned vegetables (not low-sodium or reduced-sodium). Regular canned tomato sauce and paste (not low-sodium or reduced-sodium). Regular tomato and vegetable juice (not low-sodium or reduced-sodium). Pickles. Olives. Fruits Canned fruit in a light or heavy syrup. Fried fruit. Fruit in cream or butter sauce. Meat and other protein foods Fatty cuts of meat. Ribs. Fried meat. Bacon. Sausage. Bologna and other processed lunch meats. Salami. Fatback. Hotdogs. Bratwurst. Salted nuts and seeds. Canned beans with added salt. Canned or smoked fish. Whole eggs or egg yolks. Chicken or turkey with skin. Dairy Whole or 2% milk, cream, and half-and-half. Whole or full-fat cream cheese. Whole-fat or sweetened yogurt. Full-fat cheese. Nondairy creamers. Whipped toppings.  Processed cheese and cheese spreads. Fats and oils Butter. Stick margarine. Lard. Shortening. Ghee. Bacon fat. Tropical oils, such as coconut, palm kernel, or palm oil. Seasoning and other foods Salted popcorn and pretzels. Onion salt, garlic salt, seasoned salt, table salt, and sea salt. Worcestershire sauce. Tartar sauce. Barbecue sauce. Teriyaki sauce. Soy sauce, including reduced-sodium. Steak sauce. Canned and packaged gravies. Fish sauce. Oyster sauce. Cocktail sauce. Horseradish that you find on the shelf. Ketchup. Mustard. Meat flavorings and tenderizers. Bouillon cubes. Hot sauce and Tabasco sauce. Premade or packaged marinades. Premade or packaged taco seasonings. Relishes. Regular salad dressings. Where to find more information:  National Heart, Lung, and Blood Institute: www.nhlbi.nih.gov  American Heart Association: www.heart.org Summary  The DASH eating plan is a healthy eating plan that has been shown to reduce high blood pressure (hypertension). It may also reduce your risk for type 2 diabetes, heart disease, and stroke.  With the DASH eating plan, you should limit salt (sodium) intake to 2,300 mg a day. If you have hypertension, you may need to reduce your sodium intake to 1,500 mg a day.  When on the DASH eating plan, aim to eat more fresh fruits and vegetables, whole grains, lean proteins, low-fat dairy, and heart-healthy fats.  Work with your health care provider or diet and nutrition specialist (dietitian) to adjust your eating plan to your individual   calorie needs. This information is not intended to replace advice given to you by your health care provider. Make sure you discuss any questions you have with your health care provider. Document Released: 11/07/2011 Document Revised: 11/11/2016 Document Reviewed: 11/11/2016 Elsevier Interactive Patient Education  2018 [employer]  

## 2018-09-23 NOTE — Progress Notes (Signed)
   Subjective:    Patient ID: Scott Villarreal, male    DOB: [birth year], 52 y.o.   MRN: [id number]   Hyperlipidemia   This is a chronic problem. The current episode started more than 1 year ago. Pertinent negatives include no chest pain or shortness of breath. Treatments tried: unable to take Crestor.  Risk factors for coronary artery disease include dyslipidemia and hypertension.   Patient's cholesterol is under poor control this is genetic he is trying to have a healthy diet he denies any chest tightness pressure pain shortness of breath Patient stopped Crestor after 2 days because it made him feel bad  Review of Systems  Constitutional: Negative for activity change.  HENT: Negative for congestion and rhinorrhea.   Respiratory: Negative for cough and shortness of breath.   Cardiovascular: Negative for chest pain.  Gastrointestinal: Negative for abdominal pain, diarrhea, nausea and vomiting.  Genitourinary: Negative for dysuria and hematuria.  Neurological: Negative for weakness and headaches.  Psychiatric/Behavioral: Negative for behavioral problems and confusion.       Objective:   Physical Exam  Constitutional: He appears well-nourished. No distress.  HENT:  Head: Normocephalic and atraumatic.  Eyes: Right eye exhibits no discharge. Left eye exhibits no discharge.  Neck: No tracheal deviation present.  Cardiovascular: Normal rate, regular rhythm and normal heart sounds.  No murmur heard. Pulmonary/Chest: Effort normal and breath sounds normal. No respiratory distress.  Musculoskeletal: He exhibits no edema.  Lymphadenopathy:    He has no cervical adenopathy.  Neurological: He is alert. Coordination normal.  Skin: Skin is warm and dry.  Psychiatric: He has a normal mood and affect. His behavior is normal.  Vitals reviewed.         Assessment & Plan:  HTN- Patient was seen today as part of a visit regarding hypertension. The importance of healthy diet and regular physical  activity was discussed. The importance of compliance with medications discussed.  Ideal goal is to keep blood pressure low elevated levels certainly below 140/90 when possible.  The patient was counseled that keeping blood pressure under control lessen his risk of complications.  The importance of regular follow-ups was discussed with the patient.  Low-salt diet such as DASH recommended.  Regular physical activity was recommended as well.  Patient was advised to keep regular follow-ups.  The patient was seen today as part of an evaluation regarding hyperlipidemia.  Recent lab work has been reviewed with the patient as well as the goals for good cholesterol care.  In addition to this medications have been discussed the importance of compliance with diet and medications discussed as well.  Finally the patient is aware that poor control of cholesterol, noncompliance can dramatically increase the risk of complications. The patient will keep regular office visits and the patient does agreed to periodic lab work. He is willing to try pravastatinHe will start off just twice a week then he will gradually titrated up check lab work in 3 months If problems with medicine to notify us   Follow-up by springtime

## 2018-09-29 ENCOUNTER — Encounter: Payer: Self-pay | Admitting: Family Medicine

## 2018-09-29 ENCOUNTER — Encounter (INDEPENDENT_AMBULATORY_CARE_PROVIDER_SITE_OTHER): Payer: Self-pay | Admitting: *Deleted

## 2018-10-30 IMAGING — DX DG SHOULDER 2+V*R*
3 series · 3 of 3 positions shown · non-contrast
Comparison: None.

CLINICAL DATA: Right shoulder pain for 1 month

EXAM:
RIGHT SHOULDER - 2+ VIEW

[shoulder grashey]
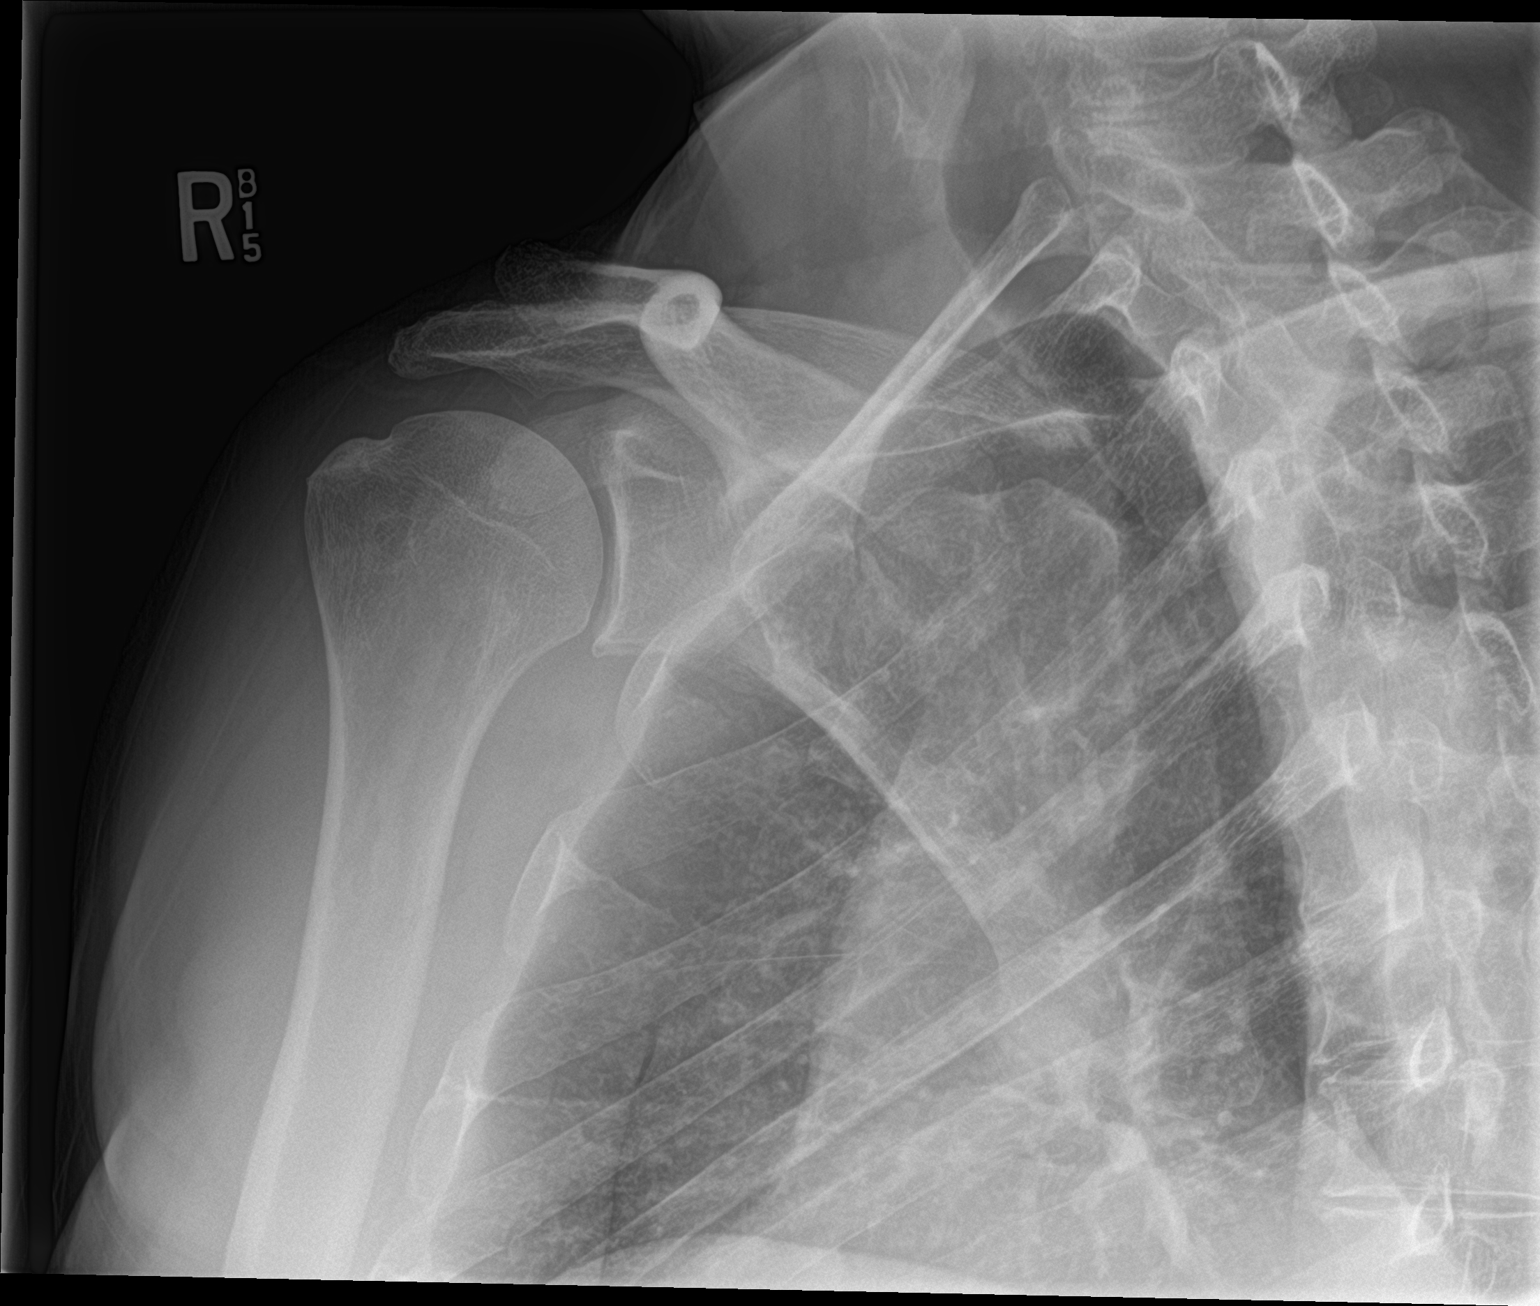

[shoulder y view]
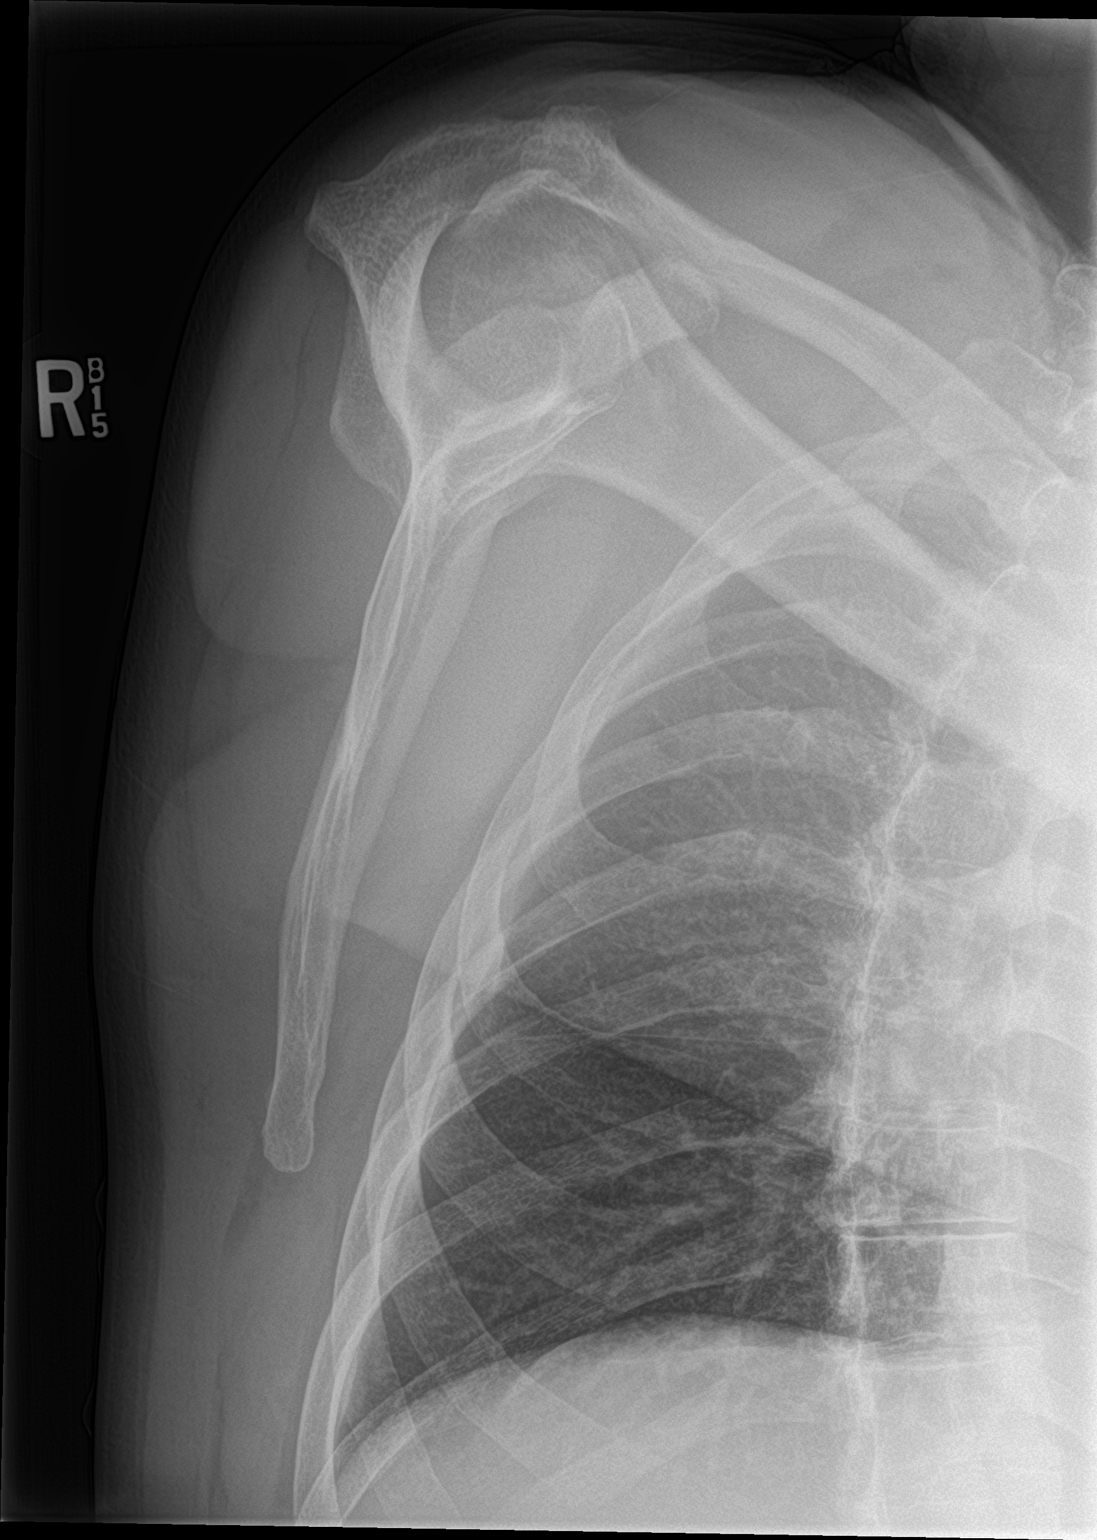

[shoulder axillary]
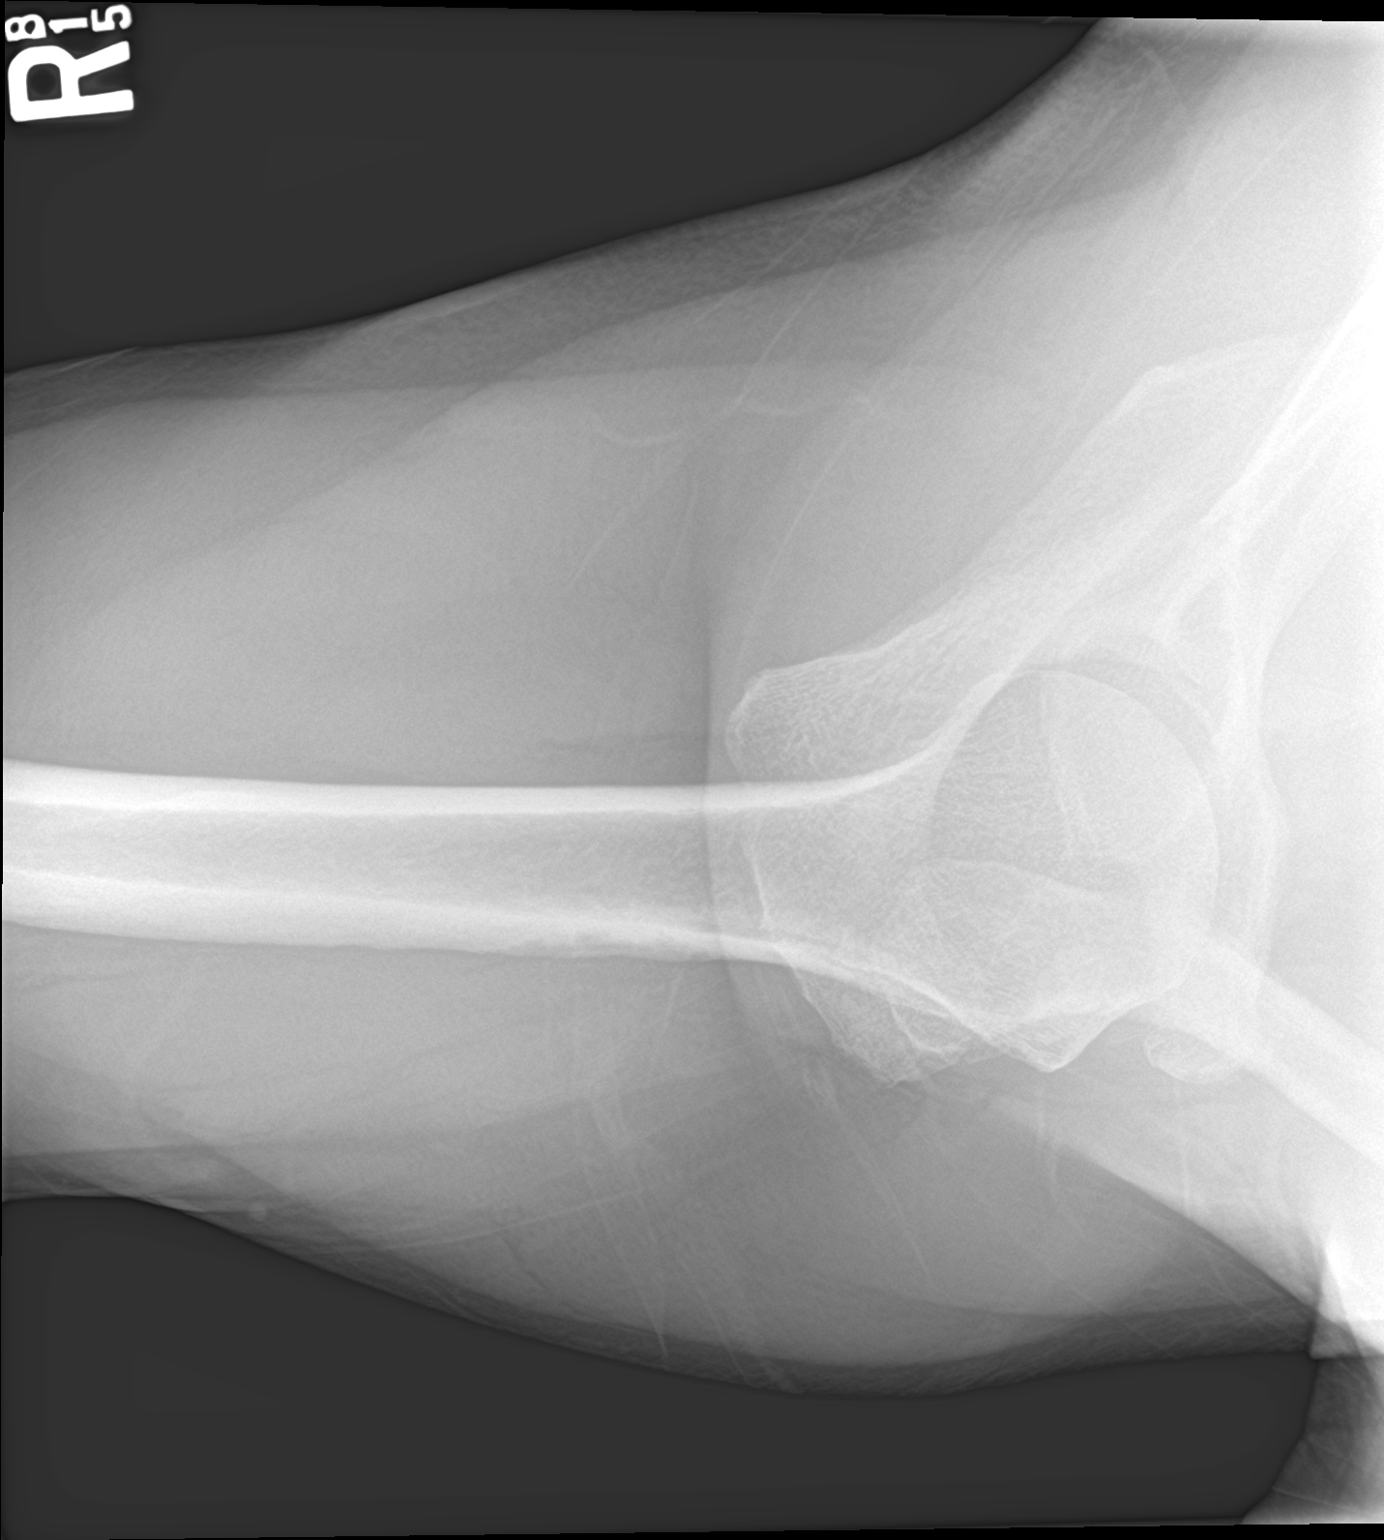

[3 of 3 positions shown; findings below may reference images not displayed]

FINDINGS: No acute fracture. No dislocation.  Unremarkable soft tissues.
IMPRESSION: No acute bony pathology.

## 2019-03-22 ENCOUNTER — Ambulatory Visit: Payer: BLUE CROSS/BLUE SHIELD | Admitting: Family Medicine

## 2019-04-02 ENCOUNTER — Other Ambulatory Visit: Payer: Self-pay | Admitting: Family Medicine

## 2019-04-05 NOTE — Telephone Encounter (Signed)
Virtual visit in 1 refill

## 2019-04-06 ENCOUNTER — Other Ambulatory Visit: Payer: Self-pay

## 2019-04-06 ENCOUNTER — Encounter: Payer: Self-pay | Admitting: Family Medicine

## 2019-04-06 ENCOUNTER — Ambulatory Visit (INDEPENDENT_AMBULATORY_CARE_PROVIDER_SITE_OTHER): Payer: BLUE CROSS/BLUE SHIELD | Admitting: Family Medicine

## 2019-04-06 VITALS — Wt 185.0 lb

## 2019-04-06 DIAGNOSIS — Z1211 Encounter for screening for malignant neoplasm of colon: Secondary | ICD-10-CM | POA: Diagnosis not present

## 2019-04-06 DIAGNOSIS — I10 Essential (primary) hypertension: Secondary | ICD-10-CM

## 2019-04-06 DIAGNOSIS — E781 Pure hyperglyceridemia: Secondary | ICD-10-CM

## 2019-04-06 MED ORDER — PRAVASTATIN SODIUM 20 MG PO TABS: 20.0000 mg | ORAL_TABLET | Freq: Every evening | ORAL | 1 refills | Status: DC

## 2019-04-06 MED ORDER — LISINOPRIL 20 MG PO TABS: ORAL_TABLET | ORAL | 1 refills | Status: DC

## 2019-04-06 NOTE — Progress Notes (Signed)
Subjective:    Patient ID: Scott Villarreal, male    DOB: 11-12-1966, 53 y.o.   MRN: [id number]  Hypertension  This is a chronic problem. There are no compliance problems.   Patient for blood pressure check up.  The patient does have hypertension.  The patient is on medication.  Patient relates compliance with meds. Todays BP reviewed with the patient. Patient denies issues with medication. Patient relates reasonable diet. Patient tries to minimize salt. Patient aware of BP goals.  Patient here for follow-up regarding cholesterol.  The patient does have hyperlipidemia.  Patient does try to maintain a reasonable diet.  Patient does take the medication on a regular basis.  Denies missing a dose.  The patient denies any obvious side effects.  Prior blood work results reviewed with the patient.  The patient is aware of his cholesterol goals and the need to keep it under good control to lessen the risk of disease. He is not taking Crestor he is taking pravastatin  pt needing refills on all medications. Pt has allergy listed to Crestor due to feeling bad; but patient is taking Crestor 10 mg daily. Pt states no problems.   Virtual Visit via Video Note  I connected with Scott Villarreal on 04/06/19 at  1:10 PM EDT by a video enabled telemedicine application and verified that I am speaking with the correct person using two identifiers. Patient doing better at taking his blood pressure medicine regular basis needs refills Not always taking his cholesterol medicine but states he is getting along with that Patient is trying to do the best can good healthy diet. Location: Patient: home Provider: office   I discussed the limitations of evaluation and management by telemedicine and the availability of in person appointments. The patient expressed understanding and agreed to proceed.  History of Present Illness:    Observations/Objective:   Assessment and Plan:   Follow Up Instructions:    I discussed the  assessment and treatment plan with the patient. The patient was provided an opportunity to ask questions and all were answered. The patient agreed with the plan and demonstrated an understanding of the instructions.   The patient was advised to call back or seek an in-person evaluation if the symptoms worsen or if the condition fails to improve as anticipated.  I provided 15 minutes of non-face-to-face time during this encounter.   Marlowe Shoresanya  Manley, LPN     Review of Systems     Objective:   Physical Exam        Assessment & Plan:  HTN- Patient was seen today as part of a visit regarding hypertension. The importance of healthy diet and regular physical activity was discussed. The importance of compliance with medications discussed.  Ideal goal is to keep blood pressure low elevated levels certainly below 140/90 when possible.  The patient was counseled that keeping blood pressure under control lessen his risk of complications.  The importance of regular follow-ups was discussed with the patient.  Low-salt diet such as DASH recommended.  Regular physical activity was recommended as well.  Patient was advised to keep regular follow-ups.  The patient was seen today as part of an evaluation regarding hyperlipidemia.  Recent lab work has been reviewed with the patient as well as the goals for good cholesterol care.  In addition to this medications have been discussed the importance of compliance with diet and medications discussed as well.  Finally the patient is aware that poor control of cholesterol, noncompliance  can dramatically increase the risk of complications. The patient will keep regular office visits and the patient does agreed to periodic lab work.  We will send in 90-day prescription with refills We will also have patient do lab work in the summer  Colonoscopy referral rocking him GI  Wellness exam in September or October at the latest

## 2019-04-20 ENCOUNTER — Encounter: Payer: Self-pay | Admitting: Gastroenterology

## 2019-05-03 ENCOUNTER — Encounter: Payer: Self-pay | Admitting: Gastroenterology

## 2019-05-25 ENCOUNTER — Telehealth: Payer: Self-pay | Admitting: Family Medicine

## 2019-05-25 ENCOUNTER — Ambulatory Visit: Payer: Self-pay

## 2019-05-25 NOTE — Telephone Encounter (Signed)
Labs were ordered at office visit in May- Patient notified and verbalized understanding.

## 2019-05-25 NOTE — Telephone Encounter (Signed)
Patient has appointment next week for physical and needing labs done done.

## 2019-05-28 DIAGNOSIS — I10 Essential (primary) hypertension: Secondary | ICD-10-CM | POA: Diagnosis not present

## 2019-05-28 DIAGNOSIS — E781 Pure hyperglyceridemia: Secondary | ICD-10-CM | POA: Diagnosis not present

## 2019-05-29 LAB — BASIC METABOLIC PANEL
BUN/Creatinine Ratio: 18 (ref 9–20)
BUN: 17 mg/dL (ref 6–24)
CO2: 21 mmol/L (ref 20–29)
Calcium: 8.9 mg/dL (ref 8.7–10.2)
Chloride: 101 mmol/L (ref 96–106)
Creatinine, Ser: 0.97 mg/dL (ref 0.76–1.27)
GFR calc Af Amer: 102 mL/min/{1.73_m2} (ref >59)
GFR calc non Af Amer: 88 mL/min/{1.73_m2} (ref >59)
Glucose: 98 mg/dL (ref 65–99)
Potassium: 5 mmol/L (ref 3.5–5.2)
Sodium: 138 mmol/L (ref 134–144)

## 2019-05-29 LAB — HEPATIC FUNCTION PANEL
ALT: 32 IU/L (ref 0–44)
AST: 21 IU/L (ref 0–40)
Albumin: 4.3 g/dL (ref 3.8–4.9)
Alkaline Phosphatase: 52 IU/L (ref 39–117)
Bilirubin Total: 0.3 mg/dL (ref 0.0–1.2)
Bilirubin, Direct: 0.1 mg/dL (ref 0.00–0.40)
Total Protein: 6.6 g/dL (ref 6.0–8.5)

## 2019-05-29 LAB — LIPID PANEL
Chol/HDL Ratio: 4.5 ratio (ref 0.0–5.0)
Cholesterol, Total: 245 mg/dL — ABNORMAL HIGH (ref 100–199)
HDL: 55 mg/dL (ref >39)
LDL Calculated: 158 mg/dL — ABNORMAL HIGH (ref 0–99)
Triglycerides: 159 mg/dL — ABNORMAL HIGH (ref 0–149)
VLDL Cholesterol Cal: 32 mg/dL (ref 5–40)

## 2019-05-29 LAB — PSA: Prostate Specific Ag, Serum: 0.6 ng/mL (ref 0.0–4.0)

## 2019-06-01 ENCOUNTER — Encounter: Payer: Self-pay | Admitting: Family Medicine

## 2019-06-01 ENCOUNTER — Other Ambulatory Visit: Payer: Self-pay

## 2019-06-01 ENCOUNTER — Ambulatory Visit (INDEPENDENT_AMBULATORY_CARE_PROVIDER_SITE_OTHER): Payer: BC Managed Care – PPO | Admitting: Family Medicine

## 2019-06-01 VITALS — BP 118/76 | Temp 98.0°F | Ht 68.0 in | Wt 183.8 lb

## 2019-06-01 DIAGNOSIS — E785 Hyperlipidemia, unspecified: Secondary | ICD-10-CM | POA: Diagnosis not present

## 2019-06-01 DIAGNOSIS — Z0000 Encounter for general adult medical examination without abnormal findings: Secondary | ICD-10-CM

## 2019-06-01 DIAGNOSIS — I10 Essential (primary) hypertension: Secondary | ICD-10-CM | POA: Diagnosis not present

## 2019-06-01 MED ORDER — SILDENAFIL CITRATE 100 MG PO TABS: 50.0000 mg | ORAL_TABLET | Freq: Every day | ORAL | 0 refills | Status: DC | PRN

## 2019-06-01 MED ORDER — SILDENAFIL CITRATE 100 MG PO TABS: 50.0000 mg | ORAL_TABLET | Freq: Every day | ORAL | 4 refills | Status: DC | PRN

## 2019-06-01 MED ORDER — LISINOPRIL 20 MG PO TABS: ORAL_TABLET | ORAL | 1 refills | Status: DC

## 2019-06-01 MED ORDER — PRAVASTATIN SODIUM 40 MG PO TABS: 40.0000 mg | ORAL_TABLET | Freq: Every evening | ORAL | 1 refills | Status: DC

## 2019-06-01 NOTE — Progress Notes (Signed)
Subjective:    Patient ID: Scott Villarreal, male    DOB: 08/30/1966, 53 y.o.   MRN: [id number]  HPI  The patient comes in today for a wellness visit.  Patient was recently seen for blood pressure and cholesterol takes his medicine generally on a regular basis except for cholesterol medicine for which he states he has about 50% compliance  A review of their health history was completed.  A review of medications was also completed.  Any needed refills; yes  Eating habits: pretty good  Falls/  MVA accidents in past few months: none  Regular exercise: very active physically demanding job  Specialist pt sees on regular basis: no  Preventative health issues were discussed.   Additional concerns: none   Review of Systems  Constitutional: Negative for diaphoresis and fatigue.  HENT: Negative for congestion and rhinorrhea.   Respiratory: Negative for cough and shortness of breath.   Cardiovascular: Negative for chest pain and leg swelling.  Gastrointestinal: Negative for abdominal pain and diarrhea.  Skin: Negative for color change and rash.  Neurological: Negative for dizziness and headaches.  Psychiatric/Behavioral: Negative for behavioral problems and confusion.       Objective:   Physical Exam Constitutional:      Appearance: He is well-developed.  HENT:     Head: Normocephalic and atraumatic.     Right Ear: External ear normal.     Left Ear: External ear normal.     Nose: Nose normal.  Eyes:     Pupils: Pupils are equal, round, and reactive to light.  Neck:     Musculoskeletal: Normal range of motion and neck supple.     Thyroid: No thyromegaly.  Cardiovascular:     Rate and Rhythm: Normal rate and regular rhythm.     Heart sounds: Normal heart sounds. No murmur.  Pulmonary:     Effort: Pulmonary effort is normal. No respiratory distress.     Breath sounds: Normal breath sounds. No wheezing.  Abdominal:     General: Bowel sounds are normal. There is no  distension.     Palpations: Abdomen is soft. There is no mass.     Tenderness: There is no abdominal tenderness.  Genitourinary:    Penis: Normal.   Musculoskeletal: Normal range of motion.  Lymphadenopathy:     Cervical: No cervical adenopathy.  Skin:    General: Skin is warm and dry.     Findings: No erythema.  Neurological:     Mental Status: He is alert.     Motor: No abnormal muscle tone.  Psychiatric:        Behavior: Behavior normal.        Judgment: Judgment normal.     Prostate exam normal      Assessment & Plan:  I encourage patient to reduce his alcohol to no more than 2 drinks per day and if possible just on the weekends  Adult wellness-complete.wellness physical was conducted today. Importance of diet and exercise were discussed in detail.  In addition to this a discussion regarding safety was also covered. We also reviewed over immunizations and gave recommendations regarding current immunization needed for age.  In addition to this additional areas were also touched on including: Preventative health exams needed:  Colonoscopy 2020 recommended.  Patient states he has one later this year  Patient was advised yearly wellness exam  His cholesterol not under good control bump up the pravastatin encourage patient to take it every single day recheck lab work  again by fall time  Blood pressure good control continue current measures  Patient overall doing well could lose a little bit of weight does need to watch his diet stay physically active

## 2019-08-25 ENCOUNTER — Ambulatory Visit (INDEPENDENT_AMBULATORY_CARE_PROVIDER_SITE_OTHER): Payer: BC Managed Care – PPO | Admitting: *Deleted

## 2019-08-25 ENCOUNTER — Other Ambulatory Visit: Payer: Self-pay

## 2019-08-25 DIAGNOSIS — Z1211 Encounter for screening for malignant neoplasm of colon: Secondary | ICD-10-CM

## 2019-08-25 NOTE — Progress Notes (Signed)
Will need OV for ETOH as patient stated "1-2 beers a day, can be more if it's a bad day."

## 2019-08-25 NOTE — Progress Notes (Signed)
Gastroenterology Pre-Procedure Review  Request Date: 08/25/2019 Requesting Physician:  Dr. Wolfgang Phoenix, no previous TCS  PATIENT REVIEW QUESTIONS: The patient responded to the following health history questions as indicated:    1. Diabetes Melitis: no 2. Joint replacements in the past 12 months: no 3. Major health problems in the past 3 months: no 4. Has an artificial valve or MVP: no 5. Has a defibrillator: no 6. Has been advised in past to take antibiotics in advance of a procedure like teeth cleaning: no 7. Family history of colon cancer: no  8. Alcohol Use: yes, 1 or 2 beers a day 9. Illicit drug Use: no 10. History of sleep apnea: no  11. History of coronary artery or other vascular stents placed within the last 12 months: no 12. History of any prior anesthesia complications: no 13. There is no height or weight on file to calculate BMI. ht: 5'9 wt: 185 lbs    MEDICATIONS & ALLERGIES:    Patient reports the following regarding taking any blood thinners:   Plavix? no Aspirin? yes Coumadin? no Brilinta? no Xarelto? no Eliquis? no Pradaxa? no Savaysa? no Effient? no  Patient confirms/reports the following medications:  Current Outpatient Medications  Medication Sig Dispense Refill  . aspirin 81 MG tablet Take 81 mg by mouth daily.    Marland Kitchen lisinopril (ZESTRIL) 20 MG tablet TAKE ONE (1) TABLET BY MOUTH EVERY DAY (Patient taking differently: daily. ) 90 tablet 1  . pravastatin (PRAVACHOL) 40 MG tablet Take 1 tablet (40 mg total) by mouth every evening. (Patient taking differently: Take 40 mg by mouth every other day. ) 90 tablet 1  . sildenafil (VIAGRA) 100 MG tablet Take 0.5-1 tablets (50-100 mg total) by mouth daily as needed for erectile dysfunction. (Patient not taking: Reported on 08/25/2019) 5 tablet 4   No current facility-administered medications for this visit.     Patient confirms/reports the following allergies:  Allergies  Allergen Reactions  . Crestor [Rosuvastatin  Calcium]     Felt bad    No orders of the defined types were placed in this encounter.   AUTHORIZATION INFORMATION Primary Insurance: [place],  [place] #: [id number],  Group #: [id number] Pre-Cert / Josem Kaufmann required: No, file to local BCBS  SCHEDULE INFORMATION: Procedure has been scheduled as follows:  Date: , Time:  Location: [facility] with Dr. Gala Romney  This Gastroenterology Pre-Precedure Review Form is being routed to the following provider(s): Walden Field, NP

## 2019-08-25 NOTE — Progress Notes (Signed)
Pt would like to wait until Jan for his procedure.  Pt aware that our Jan schedule is not available at this time.  Informed pt that I would call him once it is released unless he needs an office visit.  Pt voiced understanding.

## 2019-08-26 ENCOUNTER — Encounter: Payer: Self-pay | Admitting: *Deleted

## 2019-08-26 NOTE — Progress Notes (Signed)
Mailed pt a letter with appointment information.

## 2019-09-23 ENCOUNTER — Ambulatory Visit (INDEPENDENT_AMBULATORY_CARE_PROVIDER_SITE_OTHER): Payer: BC Managed Care – PPO | Admitting: Nurse Practitioner

## 2019-09-23 ENCOUNTER — Other Ambulatory Visit: Payer: Self-pay

## 2019-09-23 ENCOUNTER — Encounter: Payer: Self-pay | Admitting: Nurse Practitioner

## 2019-09-23 ENCOUNTER — Encounter: Payer: Self-pay | Admitting: *Deleted

## 2019-09-23 ENCOUNTER — Other Ambulatory Visit: Payer: Self-pay | Admitting: *Deleted

## 2019-09-23 DIAGNOSIS — Z1211 Encounter for screening for malignant neoplasm of colon: Secondary | ICD-10-CM

## 2019-09-23 DIAGNOSIS — Z0000 Encounter for general adult medical examination without abnormal findings: Secondary | ICD-10-CM | POA: Insufficient documentation

## 2019-09-23 MED ORDER — PEG 3350-KCL-NA BICARB-NACL 420 G PO SOLR: 4000.0000 mL | Freq: Once | ORAL | 0 refills | Status: AC

## 2019-09-23 NOTE — Progress Notes (Signed)
cc'ed to pcp °

## 2019-09-23 NOTE — Assessment & Plan Note (Signed)
The patient is here to schedule first-ever colonoscopy.  Deferred to office visit due to need for propofol.  Drinks about 2-3 beers and a shot of liquor a day for a total of about 24 beers and 7 shots of liquor a day.  Generally asymptomatic from a GI standpoint.  At this point we will proceed with colonoscopy.  Proceed with EGD on propofol/MAC with Dr. Oneida Alar in near future: the risks, benefits, and alternatives have been discussed with the patient in detail. The patient states understanding and desires to proceed.  The patient is not on any anticoagulants, anxiolytics, chronic pain medications, antidepressants, antidiabetics, or iron supplements.  Alcohol intake as per above.  Denies recreational drugs.  We will plan for the procedure on propofol/MAC to promote adequate sedation.

## 2019-09-23 NOTE — Progress Notes (Signed)
Primary Care Physician:  Babs Sciara, MD Primary Gastroenterologist:  Dr. Darrick Penna  Chief Complaint  Patient presents with  . Colonoscopy    never had tcs    HPI:   Scott Villarreal is a 53 y.o. male who presents for screening colonoscopy.  Nurse/phone triage was deferred office visit due to degree of alcohol intake likely necessitating augmented sedation.  No history of colonoscopy in our system.  Today he states he has never had a colonoscopy before. Denies abdominal pain, N/V, hematochezia, melena, fever, chills, unintentional weight loss. Denies URI or flu-like symptoms. Denies loss of sense of taste or smell. Denies chest pain, dyspnea, dizziness, lightheadedness, syncope, near syncope. Denies any other upper or lower GI symptoms.  Past Medical History:  Diagnosis Date  . Erectile dysfunction   . Hypercholesterolemia   . Hypertension     Past Surgical History:  Procedure Laterality Date  . ELBOW SURGERY Left     Current Outpatient Medications  Medication Sig Dispense Refill  . lisinopril (ZESTRIL) 20 MG tablet TAKE ONE (1) TABLET BY MOUTH EVERY DAY (Patient taking differently: daily. ) 90 tablet 1  . pravastatin (PRAVACHOL) 40 MG tablet Take 1 tablet (40 mg total) by mouth every evening. (Patient taking differently: Take 40 mg by mouth every other day. ) 90 tablet 1  . sildenafil (VIAGRA) 100 MG tablet Take 0.5-1 tablets (50-100 mg total) by mouth daily as needed for erectile dysfunction. 5 tablet 4   No current facility-administered medications for this visit.     Allergies as of 09/23/2019 - Review Complete 09/23/2019  Allergen Reaction Noted  . Crestor [rosuvastatin calcium]  09/23/2018    Family History  Problem Relation Age of Onset  . Hypertension Other   . Colon cancer Paternal Aunt     Social History   Socioeconomic History  . Marital status: Married    Spouse name: Not on file  . Number of children: Not on file  . Years of education: Not on file   . Highest education level: Not on file  Occupational History  . Not on file  Social Needs  . Financial resource strain: Not on file  . Food insecurity    Worry: Not on file    Inability: Not on file  . Transportation needs    Medical: Not on file    Non-medical: Not on file  Tobacco Use  . Smoking status: Former Smoker    Packs/day: 2.00    Years: 25.00    Pack years: 50.00    Types: Cigarettes    Quit date: 08/17/1998    Years since quitting: 21.1  . Smokeless tobacco: Never Used  Substance and Sexual Activity  . Alcohol use: Yes    Alcohol/week: 31.0 standard drinks    Types: 24 Cans of beer, 7 Shots of liquor per week    Comment: As of 09/23/2019: about 2-3 beers and shot of liquor a day.  . Drug use: No  . Sexual activity: Not on file  Lifestyle  . Physical activity    Days per week: Not on file    Minutes per session: Not on file  . Stress: Not on file  Relationships  . Social Musician on phone: Not on file    Gets together: Not on file    Attends religious service: Not on file    Active member of club or organization: Not on file    Attends meetings of clubs or organizations:  Not on file    Relationship status: Not on file  . Intimate partner violence    Fear of current or ex partner: Not on file    Emotionally abused: Not on file    Physically abused: Not on file    Forced sexual activity: Not on file  Other Topics Concern  . Not on file  Social History Narrative  . Not on file    Review of Systems: General: Negative for anorexia, weight loss, fever, chills, fatigue, weakness. ENT: Negative for hoarseness, difficulty swallowing. CV: Negative for chest pain, angina, palpitations, peripheral edema.  Respiratory: Negative for dyspnea at rest, cough, sputum, wheezing.  GI: See history of present illness. MS: Negative for joint pain, low back pain.  Derm: Negative for rash or itching.  Endo: Negative for unusual weight change.  Heme: Negative  for bruising or bleeding. Allergy: Negative for rash or hives.    Physical Exam: BP (!) 144/99   Pulse 91   Temp (!) 96.9 F (36.1 C) (Temporal)   Ht 5\' 9"  (1.753 m)   Wt 187 lb 12.8 oz (85.2 kg)   BMI 27.73 kg/m  General:   Alert and oriented. Pleasant and cooperative. Well-nourished and well-developed.  Head:  Normocephalic and atraumatic. Eyes:  Without icterus, sclera clear and conjunctiva pink.  Ears:  Normal auditory acuity. Cardiovascular:  S1, S2 present without murmurs appreciated. Extremities without clubbing or edema. Respiratory:  Clear to auscultation bilaterally. No wheezes, rales, or rhonchi. No distress.  Gastrointestinal:  +BS, soft, non-tender and non-distended. No HSM noted. No guarding or rebound. No masses appreciated.  Rectal:  Deferred  Musculoskalatal:  Symmetrical without gross deformities. Neurologic:  Alert and oriented x4;  grossly normal neurologically. Psych:  Alert and cooperative. Normal mood and affect. Heme/Lymph/Immune: No excessive bruising noted.    09/23/2019 8:58 AM   Disclaimer: This note was dictated with voice recognition software. Similar sounding words can inadvertently be transcribed and may not be corrected upon review.

## 2019-09-23 NOTE — Patient Instructions (Signed)
Your health issues we discussed today were:   Need for first-ever colonoscopy: 1. We will schedule your colonoscopy for you 2. Further recommendations will be made after colonoscopy  Overall I recommend:  1. Continue your other current medications 2. Return for follow-up based on the recommendations made after your colonoscopy 3. Call us if you have any questions or concerns.   Because of recent events of COVID-19 ("Coronavirus"), follow CDC recommendations:  Wash your hand frequently Avoid touching your face Stay away from people who are sick If you have symptoms such as fever, cough, shortness of breath then call your healthcare provider for further guidance If you are sick, STAY AT HOME unless otherwise directed by your healthcare provider. Follow directions from state and national officials regarding staying safe   At [facility] we value your feedback. You may receive a survey about your visit today. Please share your experience as we strive to create trusting relationships with our patients to provide genuine, compassionate, quality care.  We appreciate your understanding and patience as we review any laboratory studies, imaging, and other diagnostic tests that are ordered as we care for you. Our office policy is 5 business days for review of these results, and any emergent or urgent results are addressed in a timely manner for your best interest. If you do not hear from our office in 1 week, please contact us.   We also encourage the use of MyChart, which contains your medical information for your review as well. If you are not enrolled in this feature, an access code is on this after visit summary for your convenience. Thank you for allowing [place] to be involved in your care.  It was great to see you today!  I hope you have a great Fall!!

## 2019-12-01 ENCOUNTER — Ambulatory Visit: Payer: BC Managed Care – PPO | Admitting: Family Medicine

## 2019-12-20 ENCOUNTER — Telehealth: Payer: Self-pay

## 2019-12-20 NOTE — Telephone Encounter (Signed)
Endo scheduler informed.

## 2019-12-20 NOTE — Telephone Encounter (Signed)
TCS scheduled for 01/04/20 has to be cancelled d/t surge in COVID and unable to do screening procedures.  Tried to call pt, no answer, LMOVM for return call.

## 2019-12-20 NOTE — Telephone Encounter (Signed)
Tried to call pt, no answer, LMOVM for return call.  

## 2019-12-20 NOTE — Telephone Encounter (Signed)
Pt called office, informed procedure has been cancelled. Will call him to reschedule when screening procedures can be done.

## 2019-12-30 ENCOUNTER — Telehealth: Payer: Self-pay

## 2019-12-30 NOTE — Telephone Encounter (Signed)
Tried to call pt to reschedule TCS w/Prop w/SLF, no answer, LMOVM for return call.

## 2019-12-31 ENCOUNTER — Encounter (HOSPITAL_COMMUNITY): Payer: BC Managed Care – PPO

## 2019-12-31 ENCOUNTER — Other Ambulatory Visit (HOSPITAL_COMMUNITY): Payer: BC Managed Care – PPO

## 2020-01-04 ENCOUNTER — Ambulatory Visit (HOSPITAL_COMMUNITY): Admit: 2020-01-04 | Payer: BC Managed Care – PPO | Admitting: Gastroenterology

## 2020-01-04 ENCOUNTER — Encounter (HOSPITAL_COMMUNITY): Payer: Self-pay

## 2020-01-04 SURGERY — COLONOSCOPY WITH PROPOFOL
Anesthesia: Monitor Anesthesia Care

## 2020-01-12 ENCOUNTER — Encounter: Payer: Self-pay | Admitting: *Deleted

## 2020-01-12 NOTE — Telephone Encounter (Signed)
Mailed letter to pt for him to contact our office to schedule his procedure.   

## 2020-04-05 ENCOUNTER — Encounter: Payer: Self-pay | Admitting: Family Medicine

## 2020-04-05 ENCOUNTER — Ambulatory Visit: Payer: BC Managed Care – PPO | Attending: Internal Medicine

## 2020-04-05 ENCOUNTER — Other Ambulatory Visit: Payer: Self-pay

## 2020-04-05 DIAGNOSIS — Z20822 Contact with and (suspected) exposure to covid-19: Secondary | ICD-10-CM | POA: Diagnosis not present

## 2020-04-05 NOTE — Telephone Encounter (Signed)
Nurses It is possible this could be a simple virus It is also possible though a patient can be unlucky enough to pick up Covid before they are fully protected.  So in this situation I recommend the patient stay home from work.  I also recommend the patient do Covid testing-please let him know how to set up Covid testing through website.  Patient should stay home until testing shows result. Obviously if test positive patient should do a virtual visit with [place] as well as stay out of work for 10 days If test negative and patient feeling better he can go back to work. Certainly we can do a virtual visit if patient concerned and patient should let us know if any worsening issues such as difficulty breathing etc. Please communicate with patient the above with a call  Thanks-Dr. Lorin Picket

## 2020-04-06 LAB — NOVEL CORONAVIRUS, NAA: SARS-CoV-2, NAA: NOT DETECTED

## 2020-04-06 LAB — SARS-COV-2, NAA 2 DAY TAT

## 2020-04-07 ENCOUNTER — Encounter: Payer: Self-pay | Admitting: Family Medicine

## 2020-04-07 ENCOUNTER — Encounter: Payer: Self-pay | Admitting: Nurse Practitioner

## 2020-04-07 ENCOUNTER — Telehealth (INDEPENDENT_AMBULATORY_CARE_PROVIDER_SITE_OTHER): Payer: BC Managed Care – PPO | Admitting: Nurse Practitioner

## 2020-04-07 ENCOUNTER — Other Ambulatory Visit: Payer: Self-pay | Admitting: Family Medicine

## 2020-04-07 ENCOUNTER — Other Ambulatory Visit: Payer: Self-pay

## 2020-04-07 DIAGNOSIS — B9689 Other specified bacterial agents as the cause of diseases classified elsewhere: Secondary | ICD-10-CM | POA: Diagnosis not present

## 2020-04-07 DIAGNOSIS — J069 Acute upper respiratory infection, unspecified: Secondary | ICD-10-CM

## 2020-04-07 MED ORDER — AZITHROMYCIN 250 MG PO TABS: ORAL_TABLET | ORAL | 0 refills | Status: DC

## 2020-04-07 MED ORDER — LISINOPRIL 20 MG PO TABS: ORAL_TABLET | ORAL | 0 refills | Status: DC

## 2020-04-07 NOTE — Progress Notes (Signed)
   Subjective:    Patient ID: Scott Villarreal, male    DOB: 1966/08/09, 54 y.o.   MRN: [id number]  Sinus Problem This is a new problem. The current episode started in the past 7 days. Associated symptoms include congestion, coughing, sinus pressure and a sore throat. Treatments tried: mucinex.    covid test negative  Review of Systems  HENT: Positive for congestion, sinus pressure and sore throat.   Respiratory: Positive for cough.    Virtual Visit via Video Note  I connected with Scott Villarreal on 04/07/20 at 11:20 AM EDT by a video enabled telemedicine application and verified that I am speaking with the correct person using two identifiers.  Location: Patient: home Provider: office    I discussed the limitations of evaluation and management by telemedicine and the availability of in person appointments. The patient expressed understanding and agreed to proceed.  History of Present Illness: Presents for c/o cough and congestion that began earlier this week. No fever. Cough especially in the am and in the evenings. Began after being out in the rain. No wheezing. Mucus mainly clear. No sore throat or ear pain. Off/on sinus pressure/headache. Some relief with Mucinex. Cough seems to be worse. Nausea at times, earlier in the week.    Observations/Objective: Attempted to do video visit. Switched to phone visit.  Today's visit was via telephone Physical exam was not possible for this visit Alert, oriented. Occasional cough noted. No audible wheezing.   Assessment and Plan: Bacterial URI  Meds ordered this encounter  Medications  . azithromycin (ZITHROMAX Z-PAK) 250 MG tablet    Sig: Take 2 tablets (500 mg) on  Day 1,  followed by 1 tablet (250 mg) once daily on Days 2 through 5.    Dispense:  6 each    Refill:  0    Order Specific Question:   Supervising Provider    Answer:   Lilyan Punt A [[id number]]     Follow Up Instructions: Continue Mucinex as directed. Add Nasacort AQ as  directed.  Warning signs reviewed. Call back in 7-10 days if no improvement, sooner if worse.     I discussed the assessment and treatment plan with the patient. The patient was provided an opportunity to ask questions and all were answered. The patient agreed with the plan and demonstrated an understanding of the instructions.   The patient was advised to call back or seek an in-person evaluation if the symptoms worsen or if the condition fails to improve as anticipated.  I provided 15 minutes of non-face-to-face time during this encounter.        Objective:   Physical Exam        Assessment & Plan:

## 2020-05-11 ENCOUNTER — Telehealth: Payer: Self-pay | Admitting: Family Medicine

## 2020-05-11 ENCOUNTER — Other Ambulatory Visit: Payer: Self-pay | Admitting: *Deleted

## 2020-05-11 DIAGNOSIS — Z79899 Other long term (current) drug therapy: Secondary | ICD-10-CM

## 2020-05-11 DIAGNOSIS — Z125 Encounter for screening for malignant neoplasm of prostate: Secondary | ICD-10-CM

## 2020-05-11 DIAGNOSIS — Z0000 Encounter for general adult medical examination without abnormal findings: Secondary | ICD-10-CM

## 2020-05-11 DIAGNOSIS — E781 Pure hyperglyceridemia: Secondary | ICD-10-CM

## 2020-05-11 DIAGNOSIS — I10 Essential (primary) hypertension: Secondary | ICD-10-CM

## 2020-05-11 MED ORDER — LISINOPRIL 20 MG PO TABS: ORAL_TABLET | ORAL | 0 refills | Status: DC

## 2020-05-11 NOTE — Telephone Encounter (Signed)
Last seen 06/01/19 for wellness and needs refill on lisinopril and last labs were 06/01/19 lip, liv, met 7, psa. Has upcoming appt

## 2020-05-11 NOTE — Telephone Encounter (Signed)
Refill sent to [facility]. Lab orders put in and left message to return call to notify pt

## 2020-05-11 NOTE — Telephone Encounter (Signed)
Pt returned call and verbalized understanding  

## 2020-05-11 NOTE — Telephone Encounter (Signed)
Pt has schedule a PHY for July 15th at 10:30 will labs need to be done. Also Pt said his blood pressure med lisinopril (ZESTRIL) 20 MG tablet may run out by this appt

## 2020-05-11 NOTE — Telephone Encounter (Signed)
May refill lisinopril for 90 days Keep follow-up visit Lipid liver metabolic 7 PSA

## 2020-06-13 DIAGNOSIS — Z79899 Other long term (current) drug therapy: Secondary | ICD-10-CM | POA: Diagnosis not present

## 2020-06-13 DIAGNOSIS — I10 Essential (primary) hypertension: Secondary | ICD-10-CM | POA: Diagnosis not present

## 2020-06-13 DIAGNOSIS — Z0000 Encounter for general adult medical examination without abnormal findings: Secondary | ICD-10-CM | POA: Diagnosis not present

## 2020-06-13 DIAGNOSIS — E781 Pure hyperglyceridemia: Secondary | ICD-10-CM | POA: Diagnosis not present

## 2020-06-13 DIAGNOSIS — Z125 Encounter for screening for malignant neoplasm of prostate: Secondary | ICD-10-CM | POA: Diagnosis not present

## 2020-06-14 LAB — LIPID PANEL
Chol/HDL Ratio: 5.8 ratio — ABNORMAL HIGH (ref 0.0–5.0)
Cholesterol, Total: 306 mg/dL — ABNORMAL HIGH (ref 100–199)
HDL: 53 mg/dL (ref >39)
LDL Chol Calc (NIH): 175 mg/dL — ABNORMAL HIGH (ref 0–99)
Triglycerides: 397 mg/dL — ABNORMAL HIGH (ref 0–149)
VLDL Cholesterol Cal: 78 mg/dL — ABNORMAL HIGH (ref 5–40)

## 2020-06-14 LAB — BASIC METABOLIC PANEL
BUN/Creatinine Ratio: 11 (ref 9–20)
BUN: 13 mg/dL (ref 6–24)
CO2: 24 mmol/L (ref 20–29)
Calcium: 9.8 mg/dL (ref 8.7–10.2)
Chloride: 101 mmol/L (ref 96–106)
Creatinine, Ser: 1.23 mg/dL (ref 0.76–1.27)
GFR calc Af Amer: 76 mL/min/{1.73_m2} (ref >59)
GFR calc non Af Amer: 66 mL/min/{1.73_m2} (ref >59)
Glucose: 103 mg/dL — ABNORMAL HIGH (ref 65–99)
Potassium: 5.4 mmol/L — ABNORMAL HIGH (ref 3.5–5.2)
Sodium: 137 mmol/L (ref 134–144)

## 2020-06-14 LAB — HEPATIC FUNCTION PANEL
ALT: 46 IU/L — ABNORMAL HIGH (ref 0–44)
AST: 29 IU/L (ref 0–40)
Albumin: 4.7 g/dL (ref 3.8–4.9)
Alkaline Phosphatase: 60 IU/L (ref 48–121)
Bilirubin Total: 0.4 mg/dL (ref 0.0–1.2)
Bilirubin, Direct: 0.11 mg/dL (ref 0.00–0.40)
Total Protein: 7.6 g/dL (ref 6.0–8.5)

## 2020-06-14 LAB — PSA: Prostate Specific Ag, Serum: 0.6 ng/mL (ref 0.0–4.0)

## 2020-06-15 ENCOUNTER — Ambulatory Visit (INDEPENDENT_AMBULATORY_CARE_PROVIDER_SITE_OTHER): Payer: BC Managed Care – PPO | Admitting: Family Medicine

## 2020-06-15 ENCOUNTER — Other Ambulatory Visit: Payer: Self-pay

## 2020-06-15 VITALS — BP 120/74 | Ht 69.0 in

## 2020-06-15 DIAGNOSIS — Z1211 Encounter for screening for malignant neoplasm of colon: Secondary | ICD-10-CM | POA: Diagnosis not present

## 2020-06-15 DIAGNOSIS — I10 Essential (primary) hypertension: Secondary | ICD-10-CM

## 2020-06-15 DIAGNOSIS — Z0000 Encounter for general adult medical examination without abnormal findings: Secondary | ICD-10-CM

## 2020-06-15 DIAGNOSIS — E781 Pure hyperglyceridemia: Secondary | ICD-10-CM

## 2020-06-15 MED ORDER — LISINOPRIL 20 MG PO TABS: ORAL_TABLET | ORAL | 1 refills | Status: DC

## 2020-06-15 MED ORDER — PRAVASTATIN SODIUM 40 MG PO TABS: 40.0000 mg | ORAL_TABLET | Freq: Every evening | ORAL | 1 refills | Status: DC

## 2020-06-15 NOTE — Progress Notes (Signed)
° °  Subjective:    Patient ID: Scott Villarreal, male    DOB: 08/12/1966, 54 y.o.   MRN: [id number]  HPI   The patient comes in today for a wellness visit.    A review of their health history was completed.  A review of medications was also completed.  Any needed refills; yes  Eating habits: good  Falls/  MVA accidents in past few months: none  Regular exercise: walks a lot at work  Specialist pt sees on regular basis: none  Preventative health issues were discussed.   Additional concerns: none Patient does state that he consumes more alcohol than he should we discussed this at length he is taking in 3 shots of liquor every night along with 2 beers we talked about tapering this down to a safer level 1 or 2 beers per night at the most and avoiding liquor Review of Systems Patient denies any type of chest tightness pressure pain shortness breath nausea vomiting diarrhea high fever chills or sweats    Objective:   Physical Exam Lungs are clear respiratory rate normal heart regular no murmurs extremities no edema skin warm dry      Prostate exam normal Assessment & Plan:  Adult wellness-complete.wellness physical was conducted today. Importance of diet and exercise were discussed in detail.  In addition to this a discussion regarding safety was also covered. We also reviewed over immunizations and gave recommendations regarding current immunization needed for age.  In addition to this additional areas were also touched on including: Preventative health exams needed:  Colonoscopy referral for colonoscopy  Patient was advised yearly wellness exam Alcohol misuse Counseled him to help to taper down if he is having trouble to let us know  Hyperlipidemia not under good control resume medication follow-up 3 months with lab work and office visit Check met 7 in 3 months Blood pressure good control

## 2020-06-16 NOTE — Progress Notes (Signed)
Referral placed in Epic.

## 2020-06-16 NOTE — Addendum Note (Signed)
Addended by: Marlowe Shores on: 06/16/2020 08:12 AM   Modules accepted: Orders

## 2020-06-27 ENCOUNTER — Encounter: Payer: Self-pay | Admitting: Family Medicine

## 2020-06-28 ENCOUNTER — Encounter: Payer: Self-pay | Admitting: Internal Medicine

## 2020-07-05 ENCOUNTER — Encounter: Payer: Self-pay | Admitting: Family Medicine

## 2020-07-07 ENCOUNTER — Ambulatory Visit (INDEPENDENT_AMBULATORY_CARE_PROVIDER_SITE_OTHER): Payer: BC Managed Care – PPO | Admitting: Family Medicine

## 2020-07-07 ENCOUNTER — Other Ambulatory Visit: Payer: Self-pay

## 2020-07-07 ENCOUNTER — Encounter: Payer: Self-pay | Admitting: Family Medicine

## 2020-07-07 DIAGNOSIS — K529 Noninfective gastroenteritis and colitis, unspecified: Secondary | ICD-10-CM | POA: Diagnosis not present

## 2020-07-07 NOTE — Telephone Encounter (Signed)
Patient has office visit today -07/07/20 for evaluation with Clydie Braun NP

## 2020-07-07 NOTE — Progress Notes (Signed)
    Patient ID: Scott Villarreal, male    DOB: [birth year], 54 y.o.   MRN: [id number]   Chief Complaint  Patient presents with  . Stomach Issues   Subjective:    HPI Pt states he had a stomach bug on Wednesday. Shoulder and leg aches and just didn't feel good. Work advised him to get tested. COVID test came back negative. Pt states he is feeling better now but needs a work note for work.   Medical History Scott Villarreal has a past medical history of Erectile dysfunction, Hypercholesterolemia, and Hypertension.   Outpatient Encounter Medications as of 07/07/2020  Medication Sig  . lisinopril (ZESTRIL) 20 MG tablet 1 qd  . pravastatin (PRAVACHOL) 40 MG tablet Take 1 tablet (40 mg total) by mouth every evening.  . sildenafil (VIAGRA) 100 MG tablet Take 0.5-1 tablets (50-100 mg total) by mouth daily as needed for erectile dysfunction.   No facility-administered encounter medications on file as of 07/07/2020.     Review of Systems  Constitutional: Negative for chills and fever.  HENT: Negative.   Eyes: Negative.   Respiratory: Negative.   Cardiovascular: Negative.   Gastrointestinal: Negative.   Endocrine: Negative.   Genitourinary: Negative.   Musculoskeletal: Negative.   Skin: Negative.      Vitals There were no vitals taken for this visit.  Objective:   Physical Exam Constitutional:      Appearance: Normal appearance.  Cardiovascular:     Rate and Rhythm: Normal rate and regular rhythm.     Heart sounds: Normal heart sounds.  Pulmonary:     Effort: Pulmonary effort is normal.     Breath sounds: Normal breath sounds.  Skin:    General: Skin is warm and dry.  Neurological:     Mental Status: He is alert and oriented to person, place, and time.  Psychiatric:        Mood and Affect: Mood normal.        Behavior: Behavior normal.      Assessment and Plan   There are no diagnoses linked to this encounter.   Patient was seen outside as a car visit 1.  Gastroenteritis Symptoms have completely resolved. Patient reports symptoms for 2-3 days: no vomiting, positive nausea, chills, and muscle aches. Since he has missed three days of work, he is required to be checked by a healthcare provider for a return to work note.  Feels much better now and feels he is over his "stomach bug". No concerns noted today. Work note provided.   Okay to return to work on Monday, July 10, 2020   Follow-up if symptoms return.  Dorena Bodo, NP

## 2020-08-23 ENCOUNTER — Telehealth: Payer: Self-pay | Admitting: *Deleted

## 2020-08-23 ENCOUNTER — Encounter: Payer: Self-pay | Admitting: Gastroenterology

## 2020-08-23 ENCOUNTER — Other Ambulatory Visit: Payer: Self-pay

## 2020-08-23 ENCOUNTER — Telehealth (INDEPENDENT_AMBULATORY_CARE_PROVIDER_SITE_OTHER): Payer: BC Managed Care – PPO | Admitting: Gastroenterology

## 2020-08-23 DIAGNOSIS — Z1211 Encounter for screening for malignant neoplasm of colon: Secondary | ICD-10-CM

## 2020-08-23 DIAGNOSIS — Z0000 Encounter for general adult medical examination without abnormal findings: Secondary | ICD-10-CM

## 2020-08-23 NOTE — Telephone Encounter (Signed)
Scott Villarreal, you are scheduled for a virtual visit with your provider today.  Just as we do with appointments in the office, we must obtain your consent to participate.  Your consent will be active for this visit and any virtual visit you may have with one of our providers in the next 365 days.  If you have a MyChart account, I can also send a copy of this consent to you electronically.  All virtual visits are billed to your insurance company just like a traditional visit in the office.  As this is a virtual visit, video technology does not allow for your provider to perform a traditional examination.  This may limit your provider's ability to fully assess your condition.  If your provider identifies any concerns that need to be evaluated in person or the need to arrange testing such as labs, EKG, etc, we will make arrangements to do so.  Although advances in technology are sophisticated, we cannot ensure that it will always work on either your end or our end.  If the connection with a video visit is poor, we may have to switch to a telephone visit.  With either a video or telephone visit, we are not always able to ensure that we have a secure connection.   I need to obtain your verbal consent now.   Are you willing to proceed with your visit today?

## 2020-08-23 NOTE — Progress Notes (Signed)
Primary Care Physician:  Babs Sciara, MD Primary GI:  Hennie Duos. Marletta Lor, DO   Patient Location: Home  Provider Location: [facility] office  Reason for Phone Visit:  Chief Complaint  Patient presents with  . Consult    TCS never done prior.      Persons present on the phone encounter, with roles: Patient, myself (provider),mindy estudillo cma (updated meds and allergies)  Total time (minutes) spent on medical discussion: 12 minutes  Due to COVID-19, visit was conducted using telephonic method (no video was available).  Visit was requested by patient.  Virtual Visit via Telephone only  I connected with Mr. Gingras on 08/23/20 at 10:30 AM EDT by telephone and verified that I am speaking with the correct person using two identifiers.   I discussed the limitations, risks, security and privacy concerns of performing an evaluation and management service by telephone and the availability of in person appointments. I also discussed with the patient that there may be a patient responsible charge related to this service. The patient expressed understanding and agreed to proceed.   HPI:   Patient is a pleasant 54 y/o male who presents for telephone visit regarding scheduling a colonoscopy. Patient has never had a colonoscopy. Colonoscopy delayed last year due to covid.  No FH colon cancer.  Bowel movements are regular.  Denies any blood in the stool or melena.  Denies abdominal pain.  Appetite is good.  No dysphagia, heartburn, vomiting, weight loss.  He consumes alcohol when he gets home from work.  Usually several days per week.  Two or three 12 ounce beers in an evening.  Can go several days without drinking.  No history of abuse or dependency.  Currently has a bad chest cold.  Has been out of work pending Covid results which came back today is negative.  Patient previously was vaccinated back in the spring.   Paternal aunt had cancer, he believes was colon cancer.  He is unable to  verify.  Current Outpatient Medications  Medication Sig Dispense Refill  . lisinopril (ZESTRIL) 20 MG tablet 1 qd 90 tablet 1  . pravastatin (PRAVACHOL) 40 MG tablet Take 1 tablet (40 mg total) by mouth every evening. 90 tablet 1  . sildenafil (VIAGRA) 100 MG tablet Take 0.5-1 tablets (50-100 mg total) by mouth daily as needed for erectile dysfunction. 5 tablet 4   No current facility-administered medications for this visit.    Past Medical History:  Diagnosis Date  . Erectile dysfunction   . Hypercholesterolemia   . Hypertension     Past Surgical History:  Procedure Laterality Date  . ELBOW SURGERY Left     Family History  Problem Relation Age of Onset  . Hypertension Other   . Colon cancer Paternal Aunt     Social History   Socioeconomic History  . Marital status: Married    Spouse name: Not on file  . Number of children: Not on file  . Years of education: Not on file  . Highest education level: Not on file  Occupational History  . Not on file  Tobacco Use  . Smoking status: Former Smoker    Packs/day: 2.00    Years: 25.00    Pack years: 50.00    Types: Cigarettes    Quit date: 08/17/1998    Years since quitting: 22.0  . Smokeless tobacco: Never Used  Substance and Sexual Activity  . Alcohol use: Yes    Comment: 2-3 beers  per day  . Drug use: No  . Sexual activity: Not on file  Other Topics Concern  . Not on file  Social History Narrative  . Not on file   Social Determinants of Health   Financial Resource Strain:   . Difficulty of Paying Living Expenses: Not on file  Food Insecurity:   . Worried About Programme researcher, broadcasting/film/video in the Last Year: Not on file  . Ran Out of Food in the Last Year: Not on file  Transportation Needs:   . Lack of Transportation (Medical): Not on file  . Lack of Transportation (Non-Medical): Not on file  Physical Activity:   . Days of Exercise per Week: Not on file  . Minutes of Exercise per Session: Not on file  Stress:   .  Feeling of Stress : Not on file  Social Connections:   . Frequency of Communication with Friends and Family: Not on file  . Frequency of Social Gatherings with Friends and Family: Not on file  . Attends Religious Services: Not on file  . Active Member of Clubs or Organizations: Not on file  . Attends Banker Meetings: Not on file  . Marital Status: Not on file  Intimate Partner Violence:   . Fear of Current or Ex-Partner: Not on file  . Emotionally Abused: Not on file  . Physically Abused: Not on file  . Sexually Abused: Not on file      ROS:  General: Negative for anorexia, weight loss, fever, chills, fatigue, weakness. Eyes: Negative for vision changes.  ENT: Negative for hoarseness, difficulty swallowing , positive nasal congestion. CV: Negative for chest pain, angina, palpitations, dyspnea on exertion, peripheral edema.  Respiratory: Negative for dyspnea at rest, dyspnea on exertion, positive cough, sputum, wheezing.  GI: See history of present illness. GU:  Negative for dysuria, hematuria, urinary incontinence, urinary frequency, nocturnal urination.  MS: Negative for joint pain, low back pain.  Derm: Negative for rash or itching.  Neuro: Negative for weakness, abnormal sensation, seizure, frequent headaches, memory loss, confusion.  Psych: Negative for anxiety, depression, suicidal ideation, hallucinations.  Endo: Negative for unusual weight change.  Heme: Negative for bruising or bleeding. Allergy: Negative for rash or hives.   Observations/Objective: Very pleasant gentleman no acute distress.  Sounds congested.  Able to talk complete sentences without any apparent shortness of breath.  Otherwise exam unavailable.  Assessment and Plan: Pleasant 54 year old gentleman presenting to schedule first-ever colonoscopy.  Potential colon cancer in a paternal aunt.  Patient denies GI symptoms.  Plan on colonoscopy with Dr. Marletta Lor in the near future. ASA II.  I have  discussed the risks, alternatives, benefits with regards to but not limited to the risk of reaction to medication, bleeding, infection, perforation and the patient is agreeable to proceed. Written consent to be obtained.   Follow Up Instructions:    I discussed the assessment and treatment plan with the patient. The patient was provided an opportunity to ask questions and all were answered. The patient agreed with the plan and demonstrated an understanding of the instructions. AVS mailed to patient's home address.   The patient was advised to call back or seek an in-person evaluation if the symptoms worsen or if the condition fails to improve as anticipated.  I provided 12 minutes of non-face-to-face time during this encounter.   Tana Coast, PA-C

## 2020-08-23 NOTE — Telephone Encounter (Signed)
Pt consented to a virtual visit. 

## 2020-08-23 NOTE — Progress Notes (Signed)
Cc'ed to pcp °

## 2020-08-23 NOTE — Patient Instructions (Signed)
1. Colonoscopy to be scheduled. See separate instructions.  

## 2020-08-24 ENCOUNTER — Telehealth: Payer: Self-pay | Admitting: *Deleted

## 2020-08-24 NOTE — Telephone Encounter (Signed)
LMOVM to schedule TCS with Marletta Lor, asa 2

## 2020-08-28 NOTE — Telephone Encounter (Signed)
Letter mailed

## 2020-09-12 ENCOUNTER — Ambulatory Visit: Payer: BC Managed Care – PPO | Admitting: Family Medicine

## 2020-10-06 ENCOUNTER — Telehealth: Payer: Self-pay

## 2020-10-06 NOTE — Telephone Encounter (Signed)
Patient wanting to know if he needs labs done for his 3 month follow up on 11/10

## 2020-10-06 NOTE — Telephone Encounter (Signed)
Lm letting pt know labs have already been ordered previously

## 2020-10-10 DIAGNOSIS — I10 Essential (primary) hypertension: Secondary | ICD-10-CM | POA: Diagnosis not present

## 2020-10-10 DIAGNOSIS — E781 Pure hyperglyceridemia: Secondary | ICD-10-CM | POA: Diagnosis not present

## 2020-10-11 ENCOUNTER — Other Ambulatory Visit: Payer: Self-pay

## 2020-10-11 ENCOUNTER — Encounter: Payer: Self-pay | Admitting: Family Medicine

## 2020-10-11 ENCOUNTER — Ambulatory Visit: Payer: BC Managed Care – PPO | Admitting: Family Medicine

## 2020-10-11 VITALS — BP 130/86 | HR 100 | Temp 96.2°F | Wt 190.0 lb

## 2020-10-11 DIAGNOSIS — E781 Pure hyperglyceridemia: Secondary | ICD-10-CM | POA: Diagnosis not present

## 2020-10-11 DIAGNOSIS — I10 Essential (primary) hypertension: Secondary | ICD-10-CM

## 2020-10-11 DIAGNOSIS — Z789 Other specified health status: Secondary | ICD-10-CM | POA: Diagnosis not present

## 2020-10-11 LAB — BASIC METABOLIC PANEL
BUN/Creatinine Ratio: 9 (ref 9–20)
BUN: 11 mg/dL (ref 6–24)
CO2: 24 mmol/L (ref 20–29)
Calcium: 9.7 mg/dL (ref 8.7–10.2)
Chloride: 101 mmol/L (ref 96–106)
Creatinine, Ser: 1.25 mg/dL (ref 0.76–1.27)
GFR calc Af Amer: 74 mL/min/{1.73_m2} (ref >59)
GFR calc non Af Amer: 64 mL/min/{1.73_m2} (ref >59)
Glucose: 102 mg/dL — ABNORMAL HIGH (ref 65–99)
Potassium: 4.8 mmol/L (ref 3.5–5.2)
Sodium: 141 mmol/L (ref 134–144)

## 2020-10-11 LAB — HEPATIC FUNCTION PANEL
ALT: 39 IU/L (ref 0–44)
AST: 27 IU/L (ref 0–40)
Albumin: 4.6 g/dL (ref 3.8–4.9)
Alkaline Phosphatase: 65 IU/L (ref 44–121)
Bilirubin Total: 0.4 mg/dL (ref 0.0–1.2)
Bilirubin, Direct: 0.11 mg/dL (ref 0.00–0.40)
Total Protein: 7.3 g/dL (ref 6.0–8.5)

## 2020-10-11 LAB — LIPID PANEL
Chol/HDL Ratio: 5.6 ratio — ABNORMAL HIGH (ref 0.0–5.0)
Cholesterol, Total: 278 mg/dL — ABNORMAL HIGH (ref 100–199)
HDL: 50 mg/dL (ref >39)
LDL Chol Calc (NIH): 176 mg/dL — ABNORMAL HIGH (ref 0–99)
Triglycerides: 272 mg/dL — ABNORMAL HIGH (ref 0–149)
VLDL Cholesterol Cal: 52 mg/dL — ABNORMAL HIGH (ref 5–40)

## 2020-10-11 MED ORDER — PRAVASTATIN SODIUM 40 MG PO TABS: 40.0000 mg | ORAL_TABLET | Freq: Every evening | ORAL | 1 refills | Status: DC

## 2020-10-11 MED ORDER — LISINOPRIL 20 MG PO TABS: ORAL_TABLET | ORAL | 1 refills | Status: DC

## 2020-10-11 MED ORDER — FLUOXETINE HCL 10 MG PO CAPS: 10.0000 mg | ORAL_CAPSULE | Freq: Every day | ORAL | 1 refills | Status: DC

## 2020-10-11 NOTE — Telephone Encounter (Signed)
Pt seen today

## 2020-10-11 NOTE — Patient Instructions (Addendum)
Hi Scott Villarreal As we discussed today it is important to try to get the stress under better control.  The medication that I discussed with you can help take the edge off of the stress and is safe to take every day.  As we discussed sometimes this can cause nausea in the first week but after that the nausea should go away.  Should you feel like the medication is causing you significant side effects or issues feel free to stop the medicine and notify us.  Please send me an update within 3 to 4 weeks how things are going.  I would recommend that you do a follow-up visit with [place] in 3 months and recheck your cholesterol before that visit.  It is important to try to minimize alcohol use.  I would recommend that you gradually reduce the usage of alcohol over the course of the next month and if possible get to the point where it is maybe 1 beer a day during the week in a couple on the weekends.  Try to avoid liquor.  I believe that you were successful at gradually reducing this your blood pressure will improve as well as your cholesterol.  We are here to help you if you feel you are having trouble doing this and need additional advice or help please recheck.  I look forward to seeing you again in 3 months TakeCare-Dr. Nicki Reaper       DASH Eating Plan DASH stands for "Dietary Approaches to Stop Hypertension." The DASH eating plan is a healthy eating plan that has been shown to reduce high blood pressure (hypertension). It may also reduce your risk for type 2 diabetes, heart disease, and stroke. The DASH eating plan may also help with weight loss. What are tips for following this plan?  General guidelines  Avoid eating more than 2,300 mg (milligrams) of salt (sodium) a day. If you have hypertension, you may need to reduce your sodium intake to 1,500 mg a day.  Limit alcohol intake to no more than 1 drink a day for nonpregnant women and 2 drinks a day for men. One drink equals 12 oz of beer, 5 oz of wine, or 1 oz of  hard liquor.  Work with your health care provider to maintain a healthy body weight or to lose weight. Ask what an ideal weight is for you.  Get at least 30 minutes of exercise that causes your heart to beat faster (aerobic exercise) most days of the week. Activities may include walking, swimming, or biking.  Work with your health care provider or diet and nutrition specialist (dietitian) to adjust your eating plan to your individual calorie needs. Reading food labels   Check food labels for the amount of sodium per serving. Choose foods with less than 5 percent of the Daily Value of sodium. Generally, foods with less than 300 mg of sodium per serving fit into this eating plan.  To find whole grains, look for the word "whole" as the first word in the ingredient list. Shopping  Buy products labeled as "low-sodium" or "no salt added."  Buy fresh foods. Avoid canned foods and premade or frozen meals. Cooking  Avoid adding salt when cooking. Use salt-free seasonings or herbs instead of table salt or sea salt. Check with your health care provider or pharmacist before using salt substitutes.  Do not fry foods. Cook foods using healthy methods such as baking, boiling, grilling, and broiling instead.  Cook with heart-healthy oils, such as olive, canola,  soybean, or sunflower oil. Meal planning  Eat a balanced diet that includes: ? 5 or more servings of fruits and vegetables each day. At each meal, try to fill half of your plate with fruits and vegetables. ? Up to 6-8 servings of whole grains each day. ? Less than 6 oz of lean meat, poultry, or fish each day. A 3-oz serving of meat is about the same size as a deck of cards. One egg equals 1 oz. ? 2 servings of low-fat dairy each day. ? A serving of nuts, seeds, or beans 5 times each week. ? Heart-healthy fats. Healthy fats called Omega-3 fatty acids are found in foods such as flaxseeds and coldwater fish, like sardines, salmon, and  mackerel.  Limit how much you eat of the following: ? Canned or prepackaged foods. ? Food that is high in trans fat, such as fried foods. ? Food that is high in saturated fat, such as fatty meat. ? Sweets, desserts, sugary drinks, and other foods with added sugar. ? Full-fat dairy products.  Do not salt foods before eating.  Try to eat at least 2 vegetarian meals each week.  Eat more home-cooked food and less restaurant, buffet, and fast food.  When eating at a restaurant, ask that your food be prepared with less salt or no salt, if possible. What foods are recommended? The items listed may not be a complete list. Talk with your dietitian about what dietary choices are best for you. Grains Whole-grain or whole-wheat bread. Whole-grain or whole-wheat pasta. Brown rice. [place]. Bulgur. Whole-grain and low-sodium cereals. Pita bread. Low-fat, low-sodium crackers. Whole-wheat flour tortillas. Vegetables Fresh or frozen vegetables (raw, steamed, roasted, or grilled). Low-sodium or reduced-sodium tomato and vegetable juice. Low-sodium or reduced-sodium tomato sauce and tomato paste. Low-sodium or reduced-sodium canned vegetables. Fruits All fresh, dried, or frozen fruit. Canned fruit in natural juice (without added sugar). Meat and other protein foods Skinless chicken or [place]. Ground chicken or [place]. Pork with fat trimmed off. Fish and seafood. Egg whites. Dried beans, peas, or lentils. Unsalted nuts, nut butters, and seeds. Unsalted canned beans. Lean cuts of beef with fat trimmed off. Low-sodium, lean deli meat. Dairy Low-fat (1%) or fat-free (skim) milk. Fat-free, low-fat, or reduced-fat cheeses. Nonfat, low-sodium ricotta or cottage cheese. Low-fat or nonfat yogurt. Low-fat, low-sodium cheese. Fats and oils Soft margarine without trans fats. Vegetable oil. Low-fat, reduced-fat, or light mayonnaise and salad dressings (reduced-sodium). Canola, safflower, olive, soybean, and  sunflower oils. Avocado. Seasoning and other foods Herbs. Spices. Seasoning mixes without salt. Unsalted popcorn and pretzels. Fat-free sweets. What foods are not recommended? The items listed may not be a complete list. Talk with your dietitian about what dietary choices are best for you. Grains Baked goods made with fat, such as croissants, muffins, or some breads. Dry pasta or rice meal packs. Vegetables Creamed or fried vegetables. Vegetables in a cheese sauce. Regular canned vegetables (not low-sodium or reduced-sodium). Regular canned tomato sauce and paste (not low-sodium or reduced-sodium). Regular tomato and vegetable juice (not low-sodium or reduced-sodium). Angie Fava. Olives. Fruits Canned fruit in a light or heavy syrup. Fried fruit. Fruit in cream or butter sauce. Meat and other protein foods Fatty cuts of meat. Ribs. Fried meat. Berniece Salines. Sausage. Bologna and other processed lunch meats. Salami. Fatback. Hotdogs. Bratwurst. Salted nuts and seeds. Canned beans with added salt. Canned or smoked fish. Whole eggs or egg yolks. Chicken or [place] with skin. Dairy Whole or 2% milk, cream, and half-and-half. Whole or  full-fat cream cheese. Whole-fat or sweetened yogurt. Full-fat cheese. Nondairy creamers. Whipped toppings. Processed cheese and cheese spreads. Fats and oils Butter. Stick margarine. Lard. Shortening. Ghee. Bacon fat. Tropical oils, such as coconut, palm kernel, or palm oil. Seasoning and other foods Salted popcorn and pretzels. Onion salt, garlic salt, seasoned salt, table salt, and sea salt. Worcestershire sauce. Tartar sauce. Barbecue sauce. Teriyaki sauce. Soy sauce, including reduced-sodium. Steak sauce. Canned and packaged gravies. Fish sauce. Oyster sauce. Cocktail sauce. Horseradish that you find on the shelf. Ketchup. Mustard. Meat flavorings and tenderizers. Bouillon cubes. Hot sauce and Tabasco sauce. Premade or packaged marinades. Premade or packaged taco seasonings.  Relishes. Regular salad dressings. Where to find more information:  National Heart, Lung, and [place]: https://wilson-eaton.com/  American Heart Association: www.heart.org Summary  The DASH eating plan is a healthy eating plan that has been shown to reduce high blood pressure (hypertension). It may also reduce your risk for type 2 diabetes, heart disease, and stroke.  With the DASH eating plan, you should limit salt (sodium) intake to 2,300 mg a day. If you have hypertension, you may need to reduce your sodium intake to 1,500 mg a day.  When on the DASH eating plan, aim to eat more fresh fruits and vegetables, whole grains, lean proteins, low-fat dairy, and heart-healthy fats.  Work with your health care provider or diet and nutrition specialist (dietitian) to adjust your eating plan to your individual calorie needs. This information is not intended to replace advice given to you by your health care provider. Make sure you discuss any questions you have with your health care provider. Document Revised: 10/31/2017 Document Reviewed: 11/11/2016 Elsevier Patient Education  2020 [place], Adult Stress is a normal reaction to life events. Stress is what you feel when life demands more than you are used to, or more than you think you can handle. Some stress can be useful, such as studying for a test or meeting a deadline at work. Stress that occurs too often or for too long can cause problems. It can affect your emotional health and interfere with relationships and normal daily activities. Too much stress can weaken your body's defense system (immune system) and increase your risk for physical illness. If you already have a medical problem, stress can make it worse. What are the causes? All sorts of life events can cause stress. An event that causes stress for one person may not be stressful for another person. Major life events, whether positive or negative, commonly cause stress.  Examples include:  Losing a job or starting a new job.  Losing a loved one.  Moving to a new town or home.  Getting married or divorced.  Having a baby.  Getting injured or sick. Less obvious life events can also cause stress, especially if they occur day after day or in combination with each other. Examples include:  Working long hours.  Driving in traffic.  Caring for children.  Being in debt.  Being in a difficult relationship. What are the signs or symptoms? Stress can cause emotional symptoms, including:  Anxiety. This is feeling worried, afraid, on edge, overwhelmed, or out of control.  Anger, including irritation or impatience.  Depression. This is feeling sad, down, helpless, or guilty.  Trouble focusing, remembering, or making decisions. Stress can cause physical symptoms, including:  Aches and pains. These may affect your head, neck, back, stomach, or other areas of your body.  Tight muscles or a clenched jaw.  Low energy.  Trouble sleeping. Stress can cause unhealthy behaviors, including:  Eating to feel better (overeating) or skipping meals.  Working too much or putting off tasks.  Smoking, drinking alcohol, or using drugs to feel better. How is this diagnosed? Stress is diagnosed through an assessment by your health care provider. He or she may diagnose this condition based on:  Your symptoms and any stressful life events.  Your medical history.  Tests to rule out other causes of your symptoms. Depending on your condition, your health care provider may refer you to a specialist for further evaluation. How is this treated?  Stress management techniques are the recommended treatment for stress. Medicine is not typically recommended for the treatment of stress. Techniques to reduce your reaction to stressful life events include:  Stress identification. Monitor yourself for symptoms of stress and identify what causes stress for you. These  skills may help you to avoid or prepare for stressful events.  Time management. Set your priorities, keep a calendar of events, and learn to say no. Taking these actions can help you avoid making too many commitments. Techniques for coping with stress include:  Rethinking the problem. Try to think realistically about stressful events rather than ignoring them or overreacting. Try to find the positives in a stressful situation rather than focusing on the negatives.  Exercise. Physical exercise can release both physical and emotional tension. The key is to find a form of exercise that you enjoy and do it regularly.  Relaxation techniques. These relax the body and mind. The key is to find one or more that you enjoy and use the techniques regularly. Examples include: ? Meditation, deep breathing, or progressive relaxation techniques. ? Yoga or tai chi. ? Biofeedback, mindfulness techniques, or journaling. ? Listening to music, being out in nature, or participating in other hobbies.  Practicing a healthy lifestyle. Eat a balanced diet, drink plenty of water, limit or avoid caffeine, and get plenty of sleep.  Having a strong support network. Spend time with family, friends, or other people you enjoy being around. Express your feelings and talk things over with someone you trust. Counseling or talk therapy with a mental health professional may be helpful if you are having trouble managing stress on your own. Follow these instructions at home: Lifestyle   Avoid drugs.  Do not use any products that contain nicotine or tobacco, such as cigarettes, e-cigarettes, and chewing tobacco. If you need help quitting, ask your health care provider.  Limit alcohol intake to no more than 1 drink a day for nonpregnant women and 2 drinks a day for men. One drink equals 12 oz of beer, 5 oz of wine, or 1 oz of hard liquor  Do not use alcohol or drugs to relax.  Eat a balanced diet that includes fresh fruits  and vegetables, whole grains, lean meats, fish, eggs, and beans, and low-fat dairy. Avoid processed foods and foods high in added fat, sugar, and salt.  Exercise at least 30 minutes on 5 or more days each week.  Get 7-8 hours of sleep each night. General instructions   Practice stress management techniques as discussed with your health care provider.  Drink enough fluid to keep your urine clear or pale yellow.  Take over-the-counter and prescription medicines only as told by your health care provider.  Keep all follow-up visits as told by your health care provider. This is important. Contact a health care provider if:  Your symptoms get worse.  You have  new symptoms.  You feel overwhelmed by your problems and can no longer manage them on your own. Get help right away if:  You have thoughts of hurting yourself or others. If you ever feel like you may hurt yourself or others, or have thoughts about taking your own life, get help right away. You can go to your nearest emergency department or call:  Your local emergency services (911 in the [place]  A suicide crisis helpline, such as the [place] at (417)570-3843. This is open 24 hours a day. Summary  Stress is a normal reaction to life events. It can cause problems if it happens too often or for too long.  Practicing stress management techniques is the best way to treat stress.  Counseling or talk therapy with a mental health professional may be helpful if you are having trouble managing stress on your own. This information is not intended to replace advice given to you by your health care provider. Make sure you discuss any questions you have with your health care provider. Document Revised: 06/18/2019 Document Reviewed: 01/08/2017 Elsevier Patient Education  [place].

## 2020-10-11 NOTE — Progress Notes (Signed)
Subjective:    Patient ID: Scott Villarreal, male    DOB: 08/07/1966, 54 y.o.   MRN: [id number]  Hypertension This is a chronic problem. Pertinent negatives include no chest pain, headaches or shortness of breath. Risk factors for coronary artery disease include male gender. There are no compliance problems.   Pt taking Lisinopril 20 mg daily. Pt states that he has not been checking blood pressure regular.   Results for orders placed or performed in visit on 06/15/20  Lipid panel  Result Value Ref Range   Cholesterol, Total 278 (H) 100 - 199 mg/dL   Triglycerides 509 (H) 0 - 149 mg/dL   HDL 50 >32 mg/dL   VLDL Cholesterol Cal 52 (H) 5 - 40 mg/dL   LDL Chol Calc (NIH) 671 (H) 0 - 99 mg/dL   Chol/HDL Ratio 5.6 (H) 0.0 - 5.0 ratio  Hepatic function panel  Result Value Ref Range   Total Protein 7.3 6.0 - 8.5 g/dL   Albumin 4.6 3.8 - 4.9 g/dL   Bilirubin Total 0.4 0.0 - 1.2 mg/dL   Bilirubin, Direct 2.45 0.00 - 0.40 mg/dL   Alkaline Phosphatase 65 44 - 121 IU/L   AST 27 0 - 40 IU/L   ALT 39 0 - 44 IU/L  Basic metabolic panel  Result Value Ref Range   Glucose 102 (H) 65 - 99 mg/dL   BUN 11 6 - 24 mg/dL   Creatinine, Ser 8.09 0.76 - 1.27 mg/dL   GFR calc non Af Amer 64 >59 mL/min/1.73   GFR calc Af Amer 74 >59 mL/min/1.73   BUN/Creatinine Ratio 9 9 - 20   Sodium 141 134 - 144 mmol/L   Potassium 4.8 3.5 - 5.2 mmol/L   Chloride 101 96 - 106 mmol/L   CO2 24 20 - 29 mmol/L   Calcium 9.7 8.7 - 10.2 mg/dL     Review of Systems  Constitutional: Negative for diaphoresis and fatigue.  HENT: Negative for congestion and rhinorrhea.   Respiratory: Negative for cough and shortness of breath.   Cardiovascular: Negative for chest pain and leg swelling.  Gastrointestinal: Negative for abdominal pain and diarrhea.  Skin: Negative for color change and rash.  Neurological: Negative for dizziness and headaches.  Psychiatric/Behavioral: Negative for behavioral problems and confusion.         Objective:   Physical Exam Vitals reviewed.  Constitutional:      General: He is not in acute distress. HENT:     Head: Normocephalic and atraumatic.  Eyes:     General:        Right eye: No discharge.        Left eye: No discharge.  Neck:     Trachea: No tracheal deviation.  Cardiovascular:     Rate and Rhythm: Normal rate and regular rhythm.     Heart sounds: Normal heart sounds. No murmur heard.   Pulmonary:     Effort: Pulmonary effort is normal. No respiratory distress.     Breath sounds: Normal breath sounds.  Lymphadenopathy:     Cervical: No cervical adenopathy.  Skin:    General: Skin is warm and dry.  Neurological:     Mental Status: He is alert.     Coordination: Coordination normal.  Psychiatric:        Behavior: Behavior normal.           Assessment & Plan:  1. Essential hypertension, benign Needs to decrease etoh dasah diet Exercise Continue meds  2.  Hypertriglyceridemia Decrease etoh Be more regular with meds - Lipid Profile  3. Alcohol consumption of more than two drinks per day Reduce etoh gradually over next few weeks to  Reduce health effects  Also stress at work and GAD Start prozac 10mg  one qd Avoid benzo  Recheck labs and ov in 3 months

## 2020-11-21 ENCOUNTER — Ambulatory Visit
Admission: EM | Admit: 2020-11-21 | Discharge: 2020-11-21 | Disposition: A | Discharge status: Home / Self Care | Payer: BC Managed Care – PPO | Attending: Family Medicine | Admitting: Family Medicine

## 2020-11-21 ENCOUNTER — Other Ambulatory Visit: Payer: Self-pay

## 2020-11-21 ENCOUNTER — Encounter: Payer: Self-pay | Admitting: Emergency Medicine

## 2020-11-21 DIAGNOSIS — M5413 Radiculopathy, cervicothoracic region: Secondary | ICD-10-CM | POA: Diagnosis not present

## 2020-11-21 DIAGNOSIS — M79602 Pain in left arm: Secondary | ICD-10-CM | POA: Diagnosis not present

## 2020-11-21 DIAGNOSIS — M79609 Pain in unspecified limb: Secondary | ICD-10-CM

## 2020-11-21 DIAGNOSIS — R202 Paresthesia of skin: Secondary | ICD-10-CM | POA: Diagnosis not present

## 2020-11-21 MED ORDER — MELOXICAM 15 MG PO TABS: 15.0000 mg | ORAL_TABLET | Freq: Every day | ORAL | 0 refills | Status: DC

## 2020-11-21 MED ORDER — PREDNISONE 10 MG (21) PO TBPK: ORAL_TABLET | Freq: Every day | ORAL | 0 refills | Status: AC

## 2020-11-21 NOTE — Discharge Instructions (Signed)
I have sent in a prednisone taper for you to take for 6 days. 6 tablets on day one, 5 tablets on day two, 4 tablets on day three, 3 tablets on day four, 2 tablets on day five, and 1 tablet on day six.  I have sent in meloxicam for you to take daily for inflammation  Follow up with this office or with primary care if symptoms are persisting.  Follow up in the ER for high fever, trouble swallowing, trouble breathing, other concerning symptoms.

## 2020-11-21 NOTE — ED Triage Notes (Signed)
Pain to back of neck that causes throbbing pain down his LT arm w/ numbness and tingling to his fingers x 2days. Denies any injury but states pain started shortly after using a massager his wife just bought.

## 2020-11-26 NOTE — ED Provider Notes (Signed)
[facility]   [id number] 11/21/20 Arrival Time: 1135  JA:SNKNL PAIN  SUBJECTIVE: History from: patient. Scott Villarreal is a 54 y.o. male complains of pain to the back of his neck that is causing throbbing pain down the left arm.  Also reports numbness and tingling to the fingers for the last 2 days.  Denies any known injury.  Reports that he did use a massage got on his neck a couple of days ago and wonders if this could be a contributing factor.  Has not taken any medications for this.  States that he thinks he may have a pinched nerve.  Symptoms seem to be worse with activity, get a little bit better with rest. Denies similar symptoms in the past. Denies fever, chills, erythema, ecchymosis, effusion, weakness, saddle paresthesias, loss of bowel or bladder function.      ROS: As per HPI.  All other pertinent ROS negative.     Past Medical History:  Diagnosis Date   Erectile dysfunction    Hypercholesterolemia    Hypertension    Past Surgical History:  Procedure Laterality Date   ELBOW SURGERY Left    Allergies  Allergen Reactions   Crestor [Rosuvastatin Calcium]     Felt bad   No current facility-administered medications on file prior to encounter.   Current Outpatient Medications on File Prior to Encounter  Medication Sig Dispense Refill   FLUoxetine (PROZAC) 10 MG capsule Take 1 capsule (10 mg total) by mouth daily. 90 capsule 1   lisinopril (ZESTRIL) 20 MG tablet 1 qd 90 tablet 1   pravastatin (PRAVACHOL) 40 MG tablet Take 1 tablet (40 mg total) by mouth every evening. 90 tablet 1   sildenafil (VIAGRA) 100 MG tablet Take 0.5-1 tablets (50-100 mg total) by mouth daily as needed for erectile dysfunction. 5 tablet 4   Social History   Socioeconomic History   Marital status: Married    Spouse name: Not on file   Number of children: Not on file   Years of education: Not on file   Highest education level: Not on file  Occupational History   Not on file   Tobacco Use   Smoking status: Former Smoker    Packs/day: 2.00    Years: 25.00    Pack years: 50.00    Types: Cigarettes    Quit date: 08/17/1998    Years since quitting: 22.2   Smokeless tobacco: Never Used  Substance and Sexual Activity   Alcohol use: Yes    Comment: 2-3 beers per day   Drug use: No   Sexual activity: Not on file  Other Topics Concern   Not on file  Social History Narrative   Not on file   Social Determinants of Health   Financial Resource Strain: Not on file  Food Insecurity: Not on file  Transportation Needs: Not on file  Physical Activity: Not on file  Stress: Not on file  Social Connections: Not on file  Intimate Partner Violence: Not on file   Family History  Problem Relation Age of Onset   Hypertension Other    Colon cancer Paternal Aunt     OBJECTIVE:  Vitals:   11/21/20 1258 11/21/20 1259  BP:  133/86  Pulse:  87  Resp:  19  Temp:  98.2 F (36.8 C)  TempSrc:  Oral  SpO2:  97%  Weight: 190 lb (86.2 kg)   Height: 5\' 9"  (1.753 m)     General appearance: ALERT; in no acute distress.  Head: NCAT Lungs: Normal respiratory effort CV:  pulses 2+ bilaterally. Cap refill < 2 seconds Musculoskeletal:  Inspection: Skin warm, dry, clear and intact without obvious erythema, effusion, or ecchymosis.  Palpation: Left neck tender to palpation ROM: Limited ROM active and passive to left neck and arm,, equal strength bilaterally Skin: warm and dry Neurologic: Ambulates without difficulty; Sensation intact about the upper/ lower extremities Psychological: alert and cooperative; normal mood and affect  DIAGNOSTIC STUDIES:  No results found.   ASSESSMENT & PLAN:  1. Radiculopathy of cervicothoracic region   2. Left arm pain      Meds ordered this encounter  Medications   predniSONE (STERAPRED UNI-PAK 21 TAB) 10 MG (21) TBPK tablet    Sig: Take by mouth daily for 6 days. Take 6 tablets on day 1, 5 tablets on day 2, 4 tablets on  day 3, 3 tablets on day 4, 2 tablets on day 5, 1 tablet on day 6    Dispense:  21 tablet    Refill:  0    Order Specific Question:   Supervising Provider    Answer:   Merrilee Jansky [[id number]]   meloxicam (MOBIC) 15 MG tablet    Sig: Take 1 tablet (15 mg total) by mouth daily.    Dispense:  30 tablet    Refill:  0    Order Specific Question:   Supervising Provider    Answer:   Merrilee Jansky [[id number]]   Will treat as cervical radiculopathy Prescribed meloxicam once daily  Prescribed steroid taper Continue conservative management of rest, ice, and gentle stretches Take meloxicam daily for pain relief (may cause abdominal discomfort, ulcers, and GI bleeds avoid taking with other NSAIDs) Follow up with PCP if symptoms persist Return or go to the ER if you have any new or worsening symptoms (fever, chills, chest pain, abdominal pain, changes in bowel or bladder habits, pain radiating into lower legs)   Reviewed expectations re: course of current medical issues. Questions answered. Outlined signs and symptoms indicating need for more acute intervention. Patient verbalized understanding. After Visit Summary given.       Moshe Cipro, NP 11/26/20 1820

## 2020-11-27 ENCOUNTER — Other Ambulatory Visit: Payer: Self-pay

## 2020-11-27 ENCOUNTER — Ambulatory Visit
Admission: EM | Admit: 2020-11-27 | Discharge: 2020-11-27 | Disposition: A | Discharge status: Home / Self Care | Payer: BC Managed Care – PPO | Attending: Internal Medicine | Admitting: Internal Medicine

## 2020-11-27 DIAGNOSIS — S43422A Sprain of left rotator cuff capsule, initial encounter: Secondary | ICD-10-CM

## 2020-11-27 MED ORDER — IBUPROFEN 600 MG PO TABS: 600.0000 mg | ORAL_TABLET | Freq: Four times a day (QID) | ORAL | 0 refills | Status: DC | PRN

## 2020-11-27 MED ORDER — CYCLOBENZAPRINE HCL 5 MG PO TABS: 5.0000 mg | ORAL_TABLET | Freq: Three times a day (TID) | ORAL | 0 refills | Status: DC | PRN

## 2020-11-27 NOTE — Discharge Instructions (Addendum)
Apply heating pad to the left shoulder area Gentle range of motion exercises You will need orthopedic surgery follow-up for further evaluation and possibly imaging Call to make the appointment Take medications as directed.

## 2020-11-27 NOTE — ED Triage Notes (Signed)
Pt returns with continued left shoulder pain, has 2 prednisone left but states no improvement

## 2020-11-28 NOTE — ED Provider Notes (Signed)
[facility]    CSN: [id number] Arrival date & time: 11/27/20  1345      History   Chief Complaint Follow-up for left shoulder pain  HPI Scott Villarreal is a 54 y.o. male comes to the urgent care for persistent left shoulder pain.  Patient was seen in the urgent care on 11/21/2020 for left shoulder pain.  He was prescribed a course of prednisone taper and NSAID.  Patient says that the pain has not improved much and has a visit to the urgent care again.  He describes the pain as sharp and throbbing, currently 9 out of 10, aggravated by movement of the left shoulder and denies any known relieving factors.  It is associated with some numbness and tingling in the left upper arm.  He denies any neck pain.  Patient is a Naval architect and drives long distances.   HPI  Past Medical History:  Diagnosis Date  . Erectile dysfunction   . Hypercholesterolemia   . Hypertension     Patient Active Problem List   Diagnosis Date Noted  . Colon cancer screening 08/23/2020  . Gastroenteritis 07/07/2020  . Preventative health care 09/23/2019  . Essential hypertension, benign 05/07/2013  . Hypertriglyceridemia 05/07/2013    Past Surgical History:  Procedure Laterality Date  . ELBOW SURGERY Left        Home Medications    Prior to Admission medications   Medication Sig Start Date End Date Taking? Authorizing Provider  cyclobenzaprine (FLEXERIL) 5 MG tablet Take 1 tablet (5 mg total) by mouth 3 (three) times daily as needed for muscle spasms. 11/27/20  Yes Woodrow Drab, Britta Mccreedy, MD  ibuprofen (ADVIL) 600 MG tablet Take 1 tablet (600 mg total) by mouth every 6 (six) hours as needed. 11/27/20  Yes Armend Hochstatter, Britta Mccreedy, MD  FLUoxetine (PROZAC) 10 MG capsule Take 1 capsule (10 mg total) by mouth daily. 10/11/20   Babs Sciara, MD  lisinopril (ZESTRIL) 20 MG tablet 1 qd 10/11/20   Babs Sciara, MD  meloxicam (MOBIC) 15 MG tablet Take 1 tablet (15 mg total) by mouth daily. 11/21/20    Moshe Cipro, NP  pravastatin (PRAVACHOL) 40 MG tablet Take 1 tablet (40 mg total) by mouth every evening. 10/11/20   Babs Sciara, MD  sildenafil (VIAGRA) 100 MG tablet Take 0.5-1 tablets (50-100 mg total) by mouth daily as needed for erectile dysfunction. 06/01/19   Babs Sciara, MD    Family History Family History  Problem Relation Age of Onset  . Hypertension Other   . Colon cancer Paternal Aunt     Social History Social History   Tobacco Use  . Smoking status: Former Smoker    Packs/day: 2.00    Years: 25.00    Pack years: 50.00    Types: Cigarettes    Quit date: 08/17/1998    Years since quitting: 22.2  . Smokeless tobacco: Never Used  Substance Use Topics  . Alcohol use: Yes    Comment: 2-3 beers per day  . Drug use: No     Allergies   Crestor [rosuvastatin calcium]   Review of Systems Review of Systems  Constitutional: Negative.   Respiratory: Negative.   Musculoskeletal: Negative for arthralgias, joint swelling, myalgias, neck pain and neck stiffness.  Skin: Negative.   Neurological: Negative for dizziness, numbness and headaches.     Physical Exam Triage Vital Signs ED Triage Vitals  Enc Vitals Group     BP 11/27/20 1605 119/77  Pulse Rate 11/27/20 1605 (!) 102     Resp 11/27/20 1605 18     Temp 11/27/20 1605 99 F (37.2 C)     Temp src --      SpO2 11/27/20 1605 96 %     Weight --      Height --      Head Circumference --      Peak Flow --      Pain Score 11/27/20 1608 9     Pain Loc --      Pain Edu? --      Excl. in GC? --    No data found.  Updated Vital Signs BP 119/77   Pulse (!) 102   Temp 99 F (37.2 C)   Resp 18   SpO2 96%   Visual Acuity Right Eye Distance:   Left Eye Distance:   Bilateral Distance:    Right Eye Near:   Left Eye Near:    Bilateral Near:     Physical Exam Vitals and nursing note reviewed.  Constitutional:      General: He is not in acute distress.    Appearance: He is not  ill-appearing.  Cardiovascular:     Rate and Rhythm: Normal rate and regular rhythm.     Pulses: Normal pulses.     Heart sounds: Normal heart sounds.  Pulmonary:     Effort: Pulmonary effort is normal.     Breath sounds: Normal breath sounds.  Abdominal:     General: Bowel sounds are normal.     Palpations: Abdomen is soft.  Musculoskeletal:        General: Tenderness present. Normal range of motion.     Cervical back: Normal range of motion. No rigidity or tenderness.     Comments: Tenderness on the posterior aspect of the left rotator cuff.  Lymphadenopathy:     Cervical: No cervical adenopathy.  Neurological:     Mental Status: He is alert.      UC Treatments / Results  Labs (all labs ordered are listed, but only abnormal results are displayed) Labs Reviewed - No data to display  EKG   Radiology No results found.  Procedures Procedures (including critical care time)  Medications Ordered in UC Medications - No data to display  Initial Impression / Assessment and Plan / UC Course  I have reviewed the triage vital signs and the nursing notes.  Pertinent labs & imaging results that were available during my care of the patient were reviewed by me and considered in my medical decision making (see chart for details).     1.  Left shoulder pain likely rotator cuff sprain: Gentle range of motion exercises Heat therapy Muscle relaxant as needed for shoulder and neck stiffness NSAIDs Orthopedic surgery evaluation is recommended. Patient agrees to follow-up with orthopedic surgery to be evaluated further.   Final Clinical Impressions(s) / UC Diagnoses   Final diagnoses:  Sprain of left rotator cuff capsule, initial encounter     Discharge Instructions     Apply heating pad to the left shoulder area Gentle range of motion exercises You will need orthopedic surgery follow-up for further evaluation and possibly imaging Call to make the appointment Take  medications as directed.   ED Prescriptions    Medication Sig Dispense Auth. Provider   cyclobenzaprine (FLEXERIL) 5 MG tablet Take 1 tablet (5 mg total) by mouth 3 (three) times daily as needed for muscle spasms. 20 tablet Jeweldean Drohan, Britta Mccreedy, MD  ibuprofen (ADVIL) 600 MG tablet Take 1 tablet (600 mg total) by mouth every 6 (six) hours as needed. 30 tablet Tavaras Goody, Britta Mccreedy, MD     PDMP not reviewed this encounter.   Merrilee Jansky, MD 11/28/20 [phone]

## 2020-12-06 ENCOUNTER — Ambulatory Visit: Payer: BC Managed Care – PPO

## 2020-12-06 ENCOUNTER — Other Ambulatory Visit: Payer: Self-pay

## 2020-12-06 ENCOUNTER — Telehealth: Payer: Self-pay | Admitting: Orthopedic Surgery

## 2020-12-06 ENCOUNTER — Ambulatory Visit (INDEPENDENT_AMBULATORY_CARE_PROVIDER_SITE_OTHER): Payer: BC Managed Care – PPO | Admitting: Orthopedic Surgery

## 2020-12-06 ENCOUNTER — Encounter: Payer: Self-pay | Admitting: Orthopedic Surgery

## 2020-12-06 VITALS — BP 134/98 | HR 107 | Ht 69.0 in | Wt 184.0 lb

## 2020-12-06 DIAGNOSIS — M79602 Pain in left arm: Secondary | ICD-10-CM

## 2020-12-06 DIAGNOSIS — M5412 Radiculopathy, cervical region: Secondary | ICD-10-CM | POA: Diagnosis not present

## 2020-12-06 MED ORDER — PREDNISONE 10 MG (21) PO TBPK: ORAL_TABLET | ORAL | 0 refills | Status: DC

## 2020-12-06 NOTE — Progress Notes (Signed)
New Patient Visit  Assessment: Scott Villarreal is a 55 y.o. male with the following: Cervical pain with radiculopathy   Plan: Scott Villarreal has some left sided neck pain with radiculopathy.  Atraumatic onset.  He has some associated muscle spasms, and nerve irritation.  I recommended a steroid Dosepak to improve the irritation and encouraged him to continue take the aspirin, as this has improved his symptoms thus far.  In addition, he can use ice on the muscular pain, and Flexeril as needed.  We provided him with a letter excusing him from work for the next few days.  We can try physical therapy in the future, or adjust his medications as needed.  He will let us know if he needs to be seen again.  Follow-up: Return if symptoms worsen or fail to improve.  Subjective:  Chief Complaint  Patient presents with  . Neck Pain    Pt with   . Shoulder Pain    History of Present Illness: Scott Villarreal is a 55 y.o. RHD male who presents for evaluation of left-sided neck pain, and pain to his left shoulder and arm.  He reports he has had radiating pains into his left arm for the last 2 weeks.  No specific injury.  Symptoms started after receiving a massage for some lower back pain.  He was evaluated at an urgent care facility and provided with some Flexeril.  He is also taking Aspirin, which does improve his pain.  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  Massage and percussion therapy in his upper back has improved some of his symptoms.  Heat has made his pain worse.  This has never happened to him before.  No issues with his balance.   Review of Systems: No fevers or chills + numbness and tingling No chest pain No shortness of breath No bowel or bladder dysfunction No GI distress No headaches   Medical History:  Past Medical History:  Diagnosis Date  . Erectile dysfunction   . Hypercholesterolemia   . Hypertension     Past Surgical History:  Procedure  Laterality Date  . ELBOW SURGERY Left     Family History  Problem Relation Age of Onset  . Hypertension Other   . Colon cancer Paternal Aunt    Social History   Tobacco Use  . Smoking status: Former Smoker    Packs/day: 2.00    Years: 25.00    Pack years: 50.00    Types: Cigarettes    Quit date: 08/17/1998    Years since quitting: 22.3  . Smokeless tobacco: Never Used  Substance Use Topics  . Alcohol use: Yes    Comment: 2-3 beers per day  . Drug use: No    Allergies  Allergen Reactions  . Crestor [Rosuvastatin Calcium]     Felt bad    Current Meds  Medication Sig  . cyclobenzaprine (FLEXERIL) 5 MG tablet Take 1 tablet (5 mg total) by mouth 3 (three) times daily as needed for muscle spasms.  Marland Kitchen FLUoxetine (PROZAC) 10 MG capsule Take 1 capsule (10 mg total) by mouth daily.  Marland Kitchen ibuprofen (ADVIL) 600 MG tablet Take 1 tablet (600 mg total) by mouth every 6 (six) hours as needed.  Marland Kitchen lisinopril (ZESTRIL) 20 MG tablet 1 qd  . pravastatin (PRAVACHOL) 40 MG tablet Take 1 tablet (40 mg total) by mouth every evening.  . predniSONE (STERAPRED UNI-PAK 21 TAB) 10 MG (21) TBPK tablet 10  mg DS 12 days as directed    Objective: BP (!) 134/98   Pulse (!) 107   Ht 5\' 9"  (1.753 m)   Wt 184 lb (83.5 kg)   BMI 27.17 kg/m   Physical Exam:  General: Alert and oriented, no acute distress Gait: Normal  Evaluation of left upper extremity demonstrates no obvious deformity.  No atrophy is appreciated.  He has full range of motion of his neck with mild discomfort noted in the left side of his neck.  His symptoms are not exacerbated with neck rotation.  Shoulder strength including deltoids, supraspinatus, infraspinatus and subscapularis is all 5/5.  He does have some tenderness to palpation in the periscapular muscles on the left side.  Compared to the contralateral side, these muscles are more tense.  Negative Hoffmann's bilaterally.  Slightly decreased sensation to the ulnar side of his  hand.    IMAGING: I personally ordered and reviewed the following images   X-ray of the cervical spine demonstrates neutral overall alignment.  Disc heights are well-maintained.  No evidence of listhesis.  No acute injury.  Impression: Normal-appearing cervical spine   New Medications:  Meds ordered this encounter  Medications  . predniSONE (STERAPRED UNI-PAK 21 TAB) 10 MG (21) TBPK tablet    Sig: 10 mg DS 12 days as directed    Dispense:  10 tablet    Refill:  0      , MD  12/06/2020 12:15 PM

## 2020-12-06 NOTE — Patient Instructions (Signed)
Letter for work - ok to return 12/11/20

## 2020-12-06 NOTE — Telephone Encounter (Signed)
I spoke with the pharmacy and let them know per Dr. Dallas Schimke it was a dose pack for 48 tablets. No other concerns.

## 2020-12-06 NOTE — Telephone Encounter (Signed)
[facility], Tammy, called regarding prednisone prescription instructions. Please advise.

## 2021-01-12 ENCOUNTER — Encounter: Payer: Self-pay | Admitting: Orthopedic Surgery

## 2021-01-12 ENCOUNTER — Other Ambulatory Visit: Payer: Self-pay

## 2021-01-12 ENCOUNTER — Ambulatory Visit (INDEPENDENT_AMBULATORY_CARE_PROVIDER_SITE_OTHER): Payer: BC Managed Care – PPO | Admitting: Orthopedic Surgery

## 2021-01-12 VITALS — BP 124/94 | HR 101 | Ht 69.0 in | Wt 185.0 lb

## 2021-01-12 DIAGNOSIS — M5412 Radiculopathy, cervical region: Secondary | ICD-10-CM

## 2021-01-12 MED ORDER — PREDNISONE 10 MG (21) PO TBPK: ORAL_TABLET | ORAL | 0 refills | Status: DC

## 2021-01-12 MED ORDER — CYCLOBENZAPRINE HCL 5 MG PO TABS: 5.0000 mg | ORAL_TABLET | Freq: Three times a day (TID) | ORAL | 0 refills | Status: DC | PRN

## 2021-01-12 NOTE — Progress Notes (Signed)
[facility] Return  Assessment: Scott Villarreal is a 55 y.o. male with the following: Cervical radicular pain; some improvement with Prednisone and flexeril  Plan: Scott Villarreal has pain medial to the left scapula, with pain and tenderness within his posterior axilla and into his triceps.  He also is having numbness in his forearm and small/ring fingers.  Nerve compression in his cervical spine could explain his constellation of symptoms, although it is possible he has some other pathology.  He would like to try another steroid dosepak and more flexeril, as this did improve his symptoms.  If he does not get sustained relief from the medication, I will recommend an MRI.    Meds ordered this encounter  Medications  . predniSONE (STERAPRED UNI-PAK 21 TAB) 10 MG (21) TBPK tablet    Sig: 10 mg DS 12 days as directed    Dispense:  48 tablet    Refill:  0  . cyclobenzaprine (FLEXERIL) 5 MG tablet    Sig: Take 1 tablet (5 mg total) by mouth 3 (three) times daily as needed for muscle spasms.    Dispense:  20 tablet    Refill:  0    Body mass index is 27.32 kg/m.  Follow-up: Return if symptoms worsen or fail to improve.   Subjective:  Chief Complaint  Patient presents with  . Shoulder Pain    Left shoulder, Patient reports he is not aware that he injured the shoulder, PT reports some numbness and tingling when driving,     History of Present Illness: Scott Villarreal is a 55 y.o. male who returns due to persistent pain and radiating symptoms into his medial hand.  He completed the prednisone previously prescribed, and this improved some of his pain, but it returned once the medicine was done.  He is now having more pain in his posterior upper arm.  He also reports more intense numbness and tingling in his medial forearm and small and ring fingers.  These symptoms have worsened at work.  No previous injury.  Symptoms started after a lower back massage.    Review of Systems: No fevers or chills No  SOB No CP +numbness and tingling.   Objective: BP (!) 124/94   Pulse (!) 101   Ht 5\' 9"  (1.753 m)   Wt 185 lb (83.9 kg)   BMI 27.32 kg/m   Physical Exam:  Positive Tinel's cubital tunnel, but not easily recreated.  Positive Phalen's at the elbow.  Tenderness within the posterior upper arm.  Muscle spasm and tenderness medial to the left scapula.  No obvious trauma.  Previous history of left elbow ORIF, incision is well healed.  Decreased sensation to medial forearm and small and ring fingers.  Good grip strength.   IMAGING: I personally ordered and reviewed the following images:  No new imaging obtained today.   , MD 01/12/2021 9:50 PM

## 2021-06-20 ENCOUNTER — Ambulatory Visit
Admission: EM | Admit: 2021-06-20 | Discharge: 2021-06-20 | Disposition: A | Discharge status: Home / Self Care | Payer: BC Managed Care – PPO | Attending: Family Medicine | Admitting: Family Medicine

## 2021-06-20 ENCOUNTER — Other Ambulatory Visit: Payer: Self-pay

## 2021-06-20 ENCOUNTER — Encounter: Payer: Self-pay | Admitting: Emergency Medicine

## 2021-06-20 ENCOUNTER — Inpatient Hospital Stay
Admission: RE | Admit: 2021-06-20 | Discharge: 2021-06-20 | Disposition: A | Discharge status: Home / Self Care | Payer: Self-pay | Source: Ambulatory Visit

## 2021-06-20 DIAGNOSIS — M62838 Other muscle spasm: Secondary | ICD-10-CM | POA: Diagnosis not present

## 2021-06-20 MED ORDER — CYCLOBENZAPRINE HCL 10 MG PO TABS: ORAL_TABLET | ORAL | 0 refills | Status: DC

## 2021-06-20 NOTE — ED Triage Notes (Signed)
States his neck on right side started hurting on Sunday.  Pain has now moved to left side of neck
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
## 2021-06-22 ENCOUNTER — Encounter: Payer: Self-pay | Admitting: Family Medicine

## 2021-06-22 NOTE — Telephone Encounter (Signed)
Would recommend heat 15 mins 3x per day.  Icy hot or other topical meds. Take naproxen, ibuprofen or aleve otc.   If having a fever, may need to test for covid, since a lot of pt are coming up with covid this week.   If not able to wait till next week will need to see urgent care.   If not improving call back next week for follow up.   Thx.   Dr. Ladona Ridgel

## 2021-07-05 ENCOUNTER — Encounter: Payer: Self-pay | Admitting: Family Medicine

## 2021-07-05 DIAGNOSIS — Z79899 Other long term (current) drug therapy: Secondary | ICD-10-CM

## 2021-07-05 DIAGNOSIS — I10 Essential (primary) hypertension: Secondary | ICD-10-CM

## 2021-07-05 DIAGNOSIS — E781 Pure hyperglyceridemia: Secondary | ICD-10-CM

## 2021-07-05 DIAGNOSIS — Z125 Encounter for screening for malignant neoplasm of prostate: Secondary | ICD-10-CM

## 2021-07-06 MED ORDER — LISINOPRIL 20 MG PO TABS: ORAL_TABLET | ORAL | 1 refills | Status: DC

## 2021-07-06 MED ORDER — PRAVASTATIN SODIUM 40 MG PO TABS: 40.0000 mg | ORAL_TABLET | Freq: Every evening | ORAL | 1 refills | Status: DC

## 2021-07-06 NOTE — Addendum Note (Signed)
Addended by: Marlowe Shores on: 07/06/2021 09:52 AM   Modules accepted: Orders

## 2021-07-06 NOTE — Telephone Encounter (Signed)
Nurses he may have 30 days +1 refill on his blood pressure medicine.  The same goes for his cholesterol medicine.  If he needs other medications to let us know.  Also needs to do lab work including metabolic 7, liver, lipid, PSA  Needs to set up visit preferably within 30 days maximum within 60 days

## 2021-07-20 DIAGNOSIS — Z125 Encounter for screening for malignant neoplasm of prostate: Secondary | ICD-10-CM | POA: Diagnosis not present

## 2021-07-20 DIAGNOSIS — I10 Essential (primary) hypertension: Secondary | ICD-10-CM | POA: Diagnosis not present

## 2021-07-20 DIAGNOSIS — E781 Pure hyperglyceridemia: Secondary | ICD-10-CM | POA: Diagnosis not present

## 2021-07-20 DIAGNOSIS — Z79899 Other long term (current) drug therapy: Secondary | ICD-10-CM | POA: Diagnosis not present

## 2021-07-21 LAB — LIPID PANEL
Chol/HDL Ratio: 5.1 ratio — ABNORMAL HIGH (ref 0.0–5.0)
Cholesterol, Total: 269 mg/dL — ABNORMAL HIGH (ref 100–199)
HDL: 53 mg/dL (ref >39)
LDL Chol Calc (NIH): 183 mg/dL — ABNORMAL HIGH (ref 0–99)
Triglycerides: 180 mg/dL — ABNORMAL HIGH (ref 0–149)
VLDL Cholesterol Cal: 33 mg/dL (ref 5–40)

## 2021-07-21 LAB — BASIC METABOLIC PANEL
BUN/Creatinine Ratio: 14 (ref 9–20)
BUN: 14 mg/dL (ref 6–24)
CO2: 24 mmol/L (ref 20–29)
Calcium: 9.2 mg/dL (ref 8.7–10.2)
Chloride: 103 mmol/L (ref 96–106)
Creatinine, Ser: 1.02 mg/dL (ref 0.76–1.27)
Glucose: 98 mg/dL (ref 65–99)
Potassium: 4.5 mmol/L (ref 3.5–5.2)
Sodium: 141 mmol/L (ref 134–144)
eGFR: 86 mL/min/{1.73_m2} (ref >59)

## 2021-07-21 LAB — HEPATIC FUNCTION PANEL
ALT: 28 IU/L (ref 0–44)
AST: 19 IU/L (ref 0–40)
Albumin: 4.4 g/dL (ref 3.8–4.9)
Alkaline Phosphatase: 56 IU/L (ref 44–121)
Bilirubin Total: 0.3 mg/dL (ref 0.0–1.2)
Bilirubin, Direct: <0.1 mg/dL (ref 0.00–0.40)
Total Protein: 6.3 g/dL (ref 6.0–8.5)

## 2021-07-21 LAB — PSA: Prostate Specific Ag, Serum: 0.9 ng/mL (ref 0.0–4.0)

## 2021-08-21 ENCOUNTER — Ambulatory Visit: Payer: BC Managed Care – PPO | Admitting: Family Medicine

## 2021-08-21 ENCOUNTER — Encounter: Payer: Self-pay | Admitting: Family Medicine

## 2021-08-21 ENCOUNTER — Other Ambulatory Visit: Payer: Self-pay

## 2021-08-21 VITALS — BP 118/74 | HR 86 | Temp 97.4°F | Wt 185.8 lb

## 2021-08-21 DIAGNOSIS — I10 Essential (primary) hypertension: Secondary | ICD-10-CM | POA: Diagnosis not present

## 2021-08-21 DIAGNOSIS — E785 Hyperlipidemia, unspecified: Secondary | ICD-10-CM | POA: Diagnosis not present

## 2021-08-21 DIAGNOSIS — Z1211 Encounter for screening for malignant neoplasm of colon: Secondary | ICD-10-CM | POA: Diagnosis not present

## 2021-08-21 MED ORDER — LISINOPRIL 20 MG PO TABS: ORAL_TABLET | ORAL | 1 refills | Status: DC

## 2021-08-21 MED ORDER — FLUOXETINE HCL 10 MG PO CAPS: 10.0000 mg | ORAL_CAPSULE | Freq: Every day | ORAL | 1 refills | Status: DC

## 2021-08-21 MED ORDER — SILDENAFIL CITRATE 100 MG PO TABS: 50.0000 mg | ORAL_TABLET | Freq: Every day | ORAL | 4 refills | Status: DC | PRN

## 2021-08-21 MED ORDER — ATORVASTATIN CALCIUM 20 MG PO TABS: 20.0000 mg | ORAL_TABLET | Freq: Every day | ORAL | 1 refills | Status: DC

## 2021-08-21 NOTE — Progress Notes (Signed)
   Subjective:    Patient ID: Scott Villarreal, male    DOB: 07/15/1966, 55 y.o.   MRN: [id number]  HPI Pt here for HTN follow up. Pt states no issues. Taking Lisinopil 20 mg daily. Pt has stopped using muscle relaxer's due to going to chiropractor and pain is now gone.   Patient for blood pressure check up.  The patient does have hypertension.    Medication compliance-relates compliance  Blood pressure control recently-does not know how his blood pressure has been  Dietary compliance-could eat better but tries minimize salt  Patient here for follow-up regarding cholesterol.    Diet-tries minimize fried foods  Compliance with medicine-takes his medicines  Side effects-denies side effects  Activity-busy with his job  Regular lab work regarding lipid and liver was checked and if needing additional labs was appropriately ordered  Patient does have some alcohol in the evening he states he is trying to cut back   Review of Systems     Objective:   Physical Exam  General-in no acute distress Eyes-no discharge Lungs-respiratory rate normal, CTA CV-no murmurs,RRR Extremities skin warm dry no edema Neuro grossly normal Behavior normal, alert       Assessment & Plan:  1. Essential hypertension, benign Blood pressure good control continue current measures  2. Hyperlipidemia, unspecified hyperlipidemia type Recent labs look good continue medication  3. Colon cancer screening Screening recommended referral given - Ambulatory referral to Gastroenterology  4. Encounter for screening colonoscopy Screening recommended referral given - Ambulatory referral to Gastroenterology  Viagra with instructions prescription given  Stress related issues states that Prozac does well for him  Follow-up 6 months  Patient defers prostate exam today

## 2021-08-23 ENCOUNTER — Encounter: Payer: Self-pay | Admitting: Internal Medicine

## 2021-11-15 ENCOUNTER — Encounter: Payer: Self-pay | Admitting: Internal Medicine

## 2021-12-26 ENCOUNTER — Ambulatory Visit: Payer: BC Managed Care – PPO | Admitting: Gastroenterology

## 2021-12-26 ENCOUNTER — Ambulatory Visit: Payer: BC Managed Care – PPO | Admitting: Internal Medicine

## 2022-01-11 ENCOUNTER — Ambulatory Visit: Payer: BC Managed Care – PPO | Admitting: Gastroenterology

## 2022-02-18 ENCOUNTER — Ambulatory Visit: Payer: BC Managed Care – PPO | Admitting: Family Medicine

## 2022-02-22 ENCOUNTER — Ambulatory Visit (INDEPENDENT_AMBULATORY_CARE_PROVIDER_SITE_OTHER): Payer: BC Managed Care – PPO | Admitting: Family Medicine

## 2022-02-22 ENCOUNTER — Encounter: Payer: Self-pay | Admitting: Family Medicine

## 2022-02-22 ENCOUNTER — Other Ambulatory Visit: Payer: Self-pay

## 2022-02-22 DIAGNOSIS — K529 Noninfective gastroenteritis and colitis, unspecified: Secondary | ICD-10-CM | POA: Diagnosis not present

## 2022-02-22 MED ORDER — ONDANSETRON 4 MG PO TBDP: 4.0000 mg | ORAL_TABLET | Freq: Three times a day (TID) | ORAL | 0 refills | Status: DC | PRN

## 2022-02-22 NOTE — Patient Instructions (Signed)
Medication as prescribed.  ? ?Lots of fluids. ? ?Hold the Lisinopril for 48 hours. ? ?Take care ? ?Dr. Adriana Simas  ?

## 2022-02-25 DIAGNOSIS — K529 Noninfective gastroenteritis and colitis, unspecified: Secondary | ICD-10-CM | POA: Insufficient documentation

## 2022-02-25 NOTE — Progress Notes (Signed)
? ?Subjective:  ?Patient ID: Scott Villarreal, male    DOB: 15-Jul-1966  Age: 56 y.o. MRN: [id number] ? ?CC: ?Chief Complaint  ?Patient presents with  ? Diarrhea  ?  Diarrhea, chills, headache, body aches. Began Thursday morning. Had chick fil a the afternoon before.   ? ? ?HPI: ? ?56 year old male presents for evaluation of the above. ? ?Patient reports that he developed symptoms Thursday morning.  Had nausea, vomiting, diarrhea and associated chills and body aches.  No documented fever.  He states that his diarrhea has improved as well as the nausea and vomiting.  Given his symptoms, however, he wanted to be evaluated.  No current abdominal pain.  He is taking adequate PO.  No other associated symptoms.  No other complaints. ? ?Patient Active Problem List  ? Diagnosis Date Noted  ? Gastroenteritis 02/25/2022  ? Colon cancer screening 08/23/2020  ? Preventative health care 09/23/2019  ? Essential hypertension, benign 05/07/2013  ? Hyperlipidemia 05/07/2013  ? ? ?Social Hx   ?Social History  ? ?Socioeconomic History  ? Marital status: Married  ?  Spouse name: Not on file  ? Number of children: Not on file  ? Years of education: Not on file  ? Highest education level: Not on file  ?Occupational History  ? Not on file  ?Tobacco Use  ? Smoking status: Former  ?  Packs/day: 2.00  ?  Years: 25.00  ?  Pack years: 50.00  ?  Types: Cigarettes  ?  Quit date: 08/17/1998  ?  Years since quitting: 23.5  ? Smokeless tobacco: Never  ?Substance and Sexual Activity  ? Alcohol use: Yes  ?  Comment: 2-3 beers per day  ? Drug use: No  ? Sexual activity: Not on file  ?Other Topics Concern  ? Not on file  ?Social History Narrative  ? Not on file  ? ?Social Determinants of Health  ? ?Financial Resource Strain: Not on file  ?Food Insecurity: Not on file  ?Transportation Needs: Not on file  ?Physical Activity: Not on file  ?Stress: Not on file  ?Social Connections: Not on file  ? ? ?Review of Systems ?Per HPI ? ?Objective:  ?BP (!) 142/74   Pulse  94   Temp 98.2 ?F (36.8 ?C)   Wt 181 lb 3.2 oz (82.2 kg)   SpO2 98%   BMI 26.76 kg/m?  ? ? ?  02/22/2022  ?  3:25 PM 08/21/2021  ?  8:26 AM 06/20/2021  ? 10:04 AM  ?BP/Weight  ?Systolic BP [id number]  ?Diastolic BP 74 74 81  ?Wt. (Lbs) 181.2 185.8   ?BMI 26.76 kg/m2 27.44 kg/m2   ? ? ?Physical Exam ?Constitutional:   ?   General: He is not in acute distress. ?   Appearance: Normal appearance.  ?HENT:  ?   Head: Normocephalic and atraumatic.  ?Eyes:  ?   General:     ?   Right eye: No discharge.     ?   Left eye: No discharge.  ?   Conjunctiva/sclera: Conjunctivae normal.  ?Cardiovascular:  ?   Rate and Rhythm: Normal rate and regular rhythm.  ?Pulmonary:  ?   Effort: Pulmonary effort is normal.  ?   Breath sounds: Normal breath sounds. No wheezing, rhonchi or rales.  ?Abdominal:  ?   General: There is no distension.  ?   Palpations: Abdomen is soft.  ?   Tenderness: There is no abdominal tenderness.  ?Neurological:  ?  Mental Status: He is alert.  ?Psychiatric:     ?   Mood and Affect: Mood normal.     ?   Behavior: Behavior normal.  ? ? ?Lab Results  ?Component Value Date  ? WBC 4.8 12/23/2016  ? HGB 16.2 12/23/2016  ? HCT 47.1 12/23/2016  ? PLT 260 12/23/2016  ? GLUCOSE 98 07/20/2021  ? CHOL 269 (H) 07/20/2021  ? TRIG 180 (H) 07/20/2021  ? HDL 53 07/20/2021  ? LDLCALC 183 (H) 07/20/2021  ? ALT 28 07/20/2021  ? AST 19 07/20/2021  ? NA 141 07/20/2021  ? K 4.5 07/20/2021  ? CL 103 07/20/2021  ? CREATININE 1.02 07/20/2021  ? BUN 14 07/20/2021  ? CO2 24 07/20/2021  ? ? ? ?Assessment & Plan:  ? ?Problem List Items Addressed This Visit   ? ?  ? Digestive  ? Gastroenteritis  ?  Zofran as needed.  Supportive care. ?  ?  ? ? ?Meds ordered this encounter  ?Medications  ? ondansetron (ZOFRAN-ODT) 4 MG disintegrating tablet  ?  Sig: Take 1 tablet (4 mg total) by mouth every 8 (eight) hours as needed for nausea or vomiting.  ?  Dispense:  20 tablet  ?  Refill:  0  ? ?Thersa Salt DO ?[place] ? ?

## 2022-02-25 NOTE — Assessment & Plan Note (Signed)
Zofran as needed.  Supportive care. ?

## 2022-03-11 ENCOUNTER — Other Ambulatory Visit: Payer: Self-pay | Admitting: Family Medicine

## 2022-03-12 ENCOUNTER — Telehealth: Payer: Self-pay | Admitting: *Deleted

## 2022-03-12 DIAGNOSIS — E785 Hyperlipidemia, unspecified: Secondary | ICD-10-CM

## 2022-03-12 DIAGNOSIS — Z79899 Other long term (current) drug therapy: Secondary | ICD-10-CM

## 2022-03-12 DIAGNOSIS — Z1159 Encounter for screening for other viral diseases: Secondary | ICD-10-CM

## 2022-03-12 NOTE — Telephone Encounter (Signed)
Lipid, liver, metabolic 7, hepatitis C antibody ?Hyperlipidemia, hypertension, screening for CDC guidelines ?

## 2022-03-12 NOTE — Telephone Encounter (Signed)
Patient has an upcoming appointment on 03/14/22 and he wanted to know if he needs to have blood work done.  Last set of labs were done on 07/20/21, PSA, lipid,liver,Met7.  Please advise. Thank you ?

## 2022-03-12 NOTE — Telephone Encounter (Signed)
Blood work ordered in Epic. Patient notified. 

## 2022-03-13 DIAGNOSIS — E785 Hyperlipidemia, unspecified: Secondary | ICD-10-CM | POA: Diagnosis not present

## 2022-03-13 DIAGNOSIS — Z1159 Encounter for screening for other viral diseases: Secondary | ICD-10-CM | POA: Diagnosis not present

## 2022-03-13 DIAGNOSIS — Z79899 Other long term (current) drug therapy: Secondary | ICD-10-CM | POA: Diagnosis not present

## 2022-03-14 ENCOUNTER — Encounter: Payer: Self-pay | Admitting: Family Medicine

## 2022-03-14 ENCOUNTER — Ambulatory Visit: Payer: BC Managed Care – PPO | Admitting: Family Medicine

## 2022-03-14 VITALS — BP 136/94 | HR 83 | Temp 98.4°F | Ht 69.0 in | Wt 184.0 lb

## 2022-03-14 DIAGNOSIS — E782 Mixed hyperlipidemia: Secondary | ICD-10-CM | POA: Diagnosis not present

## 2022-03-14 DIAGNOSIS — I10 Essential (primary) hypertension: Secondary | ICD-10-CM

## 2022-03-14 DIAGNOSIS — E785 Hyperlipidemia, unspecified: Secondary | ICD-10-CM

## 2022-03-14 DIAGNOSIS — Z125 Encounter for screening for malignant neoplasm of prostate: Secondary | ICD-10-CM | POA: Diagnosis not present

## 2022-03-14 LAB — HEPATIC FUNCTION PANEL
ALT: 48 IU/L — ABNORMAL HIGH (ref 0–44)
AST: 36 IU/L (ref 0–40)
Albumin: 4.7 g/dL (ref 3.8–4.9)
Alkaline Phosphatase: 62 IU/L (ref 44–121)
Bilirubin Total: 0.5 mg/dL (ref 0.0–1.2)
Bilirubin, Direct: 0.11 mg/dL (ref 0.00–0.40)
Total Protein: 7 g/dL (ref 6.0–8.5)

## 2022-03-14 LAB — BASIC METABOLIC PANEL
BUN/Creatinine Ratio: 14 (ref 9–20)
BUN: 16 mg/dL (ref 6–24)
CO2: 27 mmol/L (ref 20–29)
Calcium: 9.7 mg/dL (ref 8.7–10.2)
Chloride: 103 mmol/L (ref 96–106)
Creatinine, Ser: 1.13 mg/dL (ref 0.76–1.27)
Glucose: 106 mg/dL — ABNORMAL HIGH (ref 70–99)
Potassium: 4.5 mmol/L (ref 3.5–5.2)
Sodium: 143 mmol/L (ref 134–144)
eGFR: 76 mL/min/{1.73_m2} (ref >59)

## 2022-03-14 LAB — HEPATITIS C ANTIBODY: Hep C Virus Ab: NONREACTIVE

## 2022-03-14 LAB — LIPID PANEL
Chol/HDL Ratio: 4.1 ratio (ref 0.0–5.0)
Cholesterol, Total: 236 mg/dL — ABNORMAL HIGH (ref 100–199)
HDL: 58 mg/dL (ref >39)
LDL Chol Calc (NIH): 146 mg/dL — ABNORMAL HIGH (ref 0–99)
Triglycerides: 177 mg/dL — ABNORMAL HIGH (ref 0–149)
VLDL Cholesterol Cal: 32 mg/dL (ref 5–40)

## 2022-03-14 MED ORDER — ATORVASTATIN CALCIUM 40 MG PO TABS: 20.0000 mg | ORAL_TABLET | Freq: Every day | ORAL | 1 refills | Status: DC

## 2022-03-14 MED ORDER — SILDENAFIL CITRATE 100 MG PO TABS: 50.0000 mg | ORAL_TABLET | Freq: Every day | ORAL | 4 refills | Status: AC | PRN

## 2022-03-14 NOTE — Patient Instructions (Signed)
Hi Scott Villarreal ?It was good to see you today ? ?Please work hard at minimizing salt in your diet. ?Less fried food ?More vegetables ? ?Please do your blood work before your follow-up visit in August for a wellness checkup ? ?Follow-up sooner if any problems TakeCare-Dr. Lorin Picket ?

## 2022-03-14 NOTE — Progress Notes (Signed)
? ?  Subjective:  ? ? Patient ID: Scott Villarreal, male    DOB: 1966-08-13, 56 y.o.   MRN: [id number] ? ?Hypertension ?This is a chronic problem. Treatments tried: lisinopril.  ? ?We talked at length about the importance of healthy diet ?He does take his medicines ?He does admit that he could do better with his diet ?Takes in too much in the way of fried foods starchy foods and sugary foods. ?He is very active at work but needs to fit in increase walking ? ?Review of Systems ? ?   ?Objective:  ? Physical Exam ?General-in no acute distress ?Eyes-no discharge ?Lungs-respiratory rate normal, CTA ?CV-no murmurs,RRR ?Extremities skin warm dry no edema ?Neuro grossly normal ?Behavior normal, alert ? ? ? ? ?   ?Assessment & Plan:  ?Elevated blood pressure-very important for the patient to minimize fried foods increase vegetables and fruits.  Try to fit in some walking on the weekends.  In addition to this limit alcohol to no more than 2 drinks per day.  Would be healthier to be off of it totally but this would be helpful of to limit it ?If blood pressure is not improved by time he comes back we will need to adjust medications information given regarding healthier diet ? ?Hyperlipidemia bump up dose of statin to get LDL below 70 if possible and below 100 at the minimum healthier diet would help as well minimizing fried foods fatty foods ? ?Erectile dysfunction Viagra refill given. ? ?Patient will be doing colonoscopy this summer ? ?Follow-up in August for wellness checkup PSA blood work etc. ?If blood pressure is not well enough by the time he comes back we will need to bump up medications or add additional meds ?

## 2022-04-18 ENCOUNTER — Encounter: Payer: Self-pay | Admitting: Gastroenterology

## 2022-04-18 ENCOUNTER — Telehealth: Payer: Self-pay

## 2022-04-18 ENCOUNTER — Ambulatory Visit: Payer: BC Managed Care – PPO | Admitting: Gastroenterology

## 2022-04-18 VITALS — BP 130/90 | HR 86 | Temp 98.3°F | Ht 69.0 in | Wt 182.6 lb

## 2022-04-18 DIAGNOSIS — Z1211 Encounter for screening for malignant neoplasm of colon: Secondary | ICD-10-CM

## 2022-04-18 NOTE — Telephone Encounter (Signed)
Tried to call pt to schedule TCS ASA 2 w/Dr. Abbey Chatters, LMOVM for return call. No answer home#.

## 2022-04-18 NOTE — Patient Instructions (Signed)
We are arranging a colonoscopy with Dr. Carver in the near future. ? ?Further recommendations to follow! ? ?It was a pleasure to see you today. I want to create trusting relationships with patients to provide genuine, compassionate, and quality care. I value your feedback. If you receive a survey regarding your visit,  I greatly appreciate you taking time to fill this out.  ? ?Leisha Trinkle W. Levie Wages, PhD, ANP-BC ?[facility]  ? ?

## 2022-04-18 NOTE — Progress Notes (Signed)
Gastroenterology Office Note    Referring Provider: Babs Sciara, MD Primary Care Physician:  Babs Sciara, MD  Primary GI: Dr. Marletta Lor    Chief Complaint   Chief Complaint  Patient presents with   Colonoscopy     History of Present Illness   Scott Villarreal is a 56 y.o. male presenting today at the request of Luking, [name], MD due to need for screening colonoscopy.  No abdominal pain, N/V, changes in bowel habits, constipation, diarrhea, overt GI bleeding, GERD, dysphagia, unexplained weight loss, lack of appetite, unexplained weight gain.   No first-degree relatives with history of colon cancer or polyps. He does have a paternal aunt with hx of colon cancer.        Past Medical History:  Diagnosis Date   Erectile dysfunction    Hypercholesterolemia    Hypertension     Past Surgical History:  Procedure Laterality Date   ELBOW SURGERY Left     Current Outpatient Medications  Medication Sig Dispense Refill   atorvastatin (LIPITOR) 40 MG tablet Take 0.5 tablets (20 mg total) by mouth daily. 90 tablet 1   FLUoxetine (PROZAC) 10 MG capsule Take 1 capsule (10 mg total) by mouth daily. 90 capsule 1   lisinopril (ZESTRIL) 20 MG tablet TAKE ONE (1) TABLET BY MOUTH EVERY DAY 90 tablet 1   ondansetron (ZOFRAN-ODT) 4 MG disintegrating tablet Take 1 tablet (4 mg total) by mouth every 8 (eight) hours as needed for nausea or vomiting. 20 tablet 0   sildenafil (VIAGRA) 100 MG tablet Take 0.5-1 tablets (50-100 mg total) by mouth daily as needed for erectile dysfunction. 10 tablet 4   No current facility-administered medications for this visit.    Allergies as of 04/18/2022   (No Known Allergies)    Family History  Problem Relation Age of Onset   Hypertension Other    Colon cancer Paternal Aunt     Social History   Socioeconomic History   Marital status: Married    Spouse name: Not on file   Number of children: Not on file   Years of education: Not on file    Highest education level: Not on file  Occupational History   Not on file  Tobacco Use   Smoking status: Former    Packs/day: 2.00    Years: 25.00    Pack years: 50.00    Types: Cigarettes    Quit date: 08/17/1998    Years since quitting: 23.6   Smokeless tobacco: Never  Substance and Sexual Activity   Alcohol use: Yes    Alcohol/week: 14.0 standard drinks    Types: 14 Cans of beer per week   Drug use: No   Sexual activity: Yes  Other Topics Concern   Not on file  Social History Narrative   Not on file   Social Determinants of Health   Financial Resource Strain: Not on file  Food Insecurity: Not on file  Transportation Needs: Not on file  Physical Activity: Not on file  Stress: Not on file  Social Connections: Not on file  Intimate Partner Violence: Not on file     Review of Systems   Gen: Denies any fever, chills, fatigue, weight loss, lack of appetite.  CV: Denies chest pain, heart palpitations, peripheral edema, syncope.  Resp: Denies shortness of breath at rest or with exertion. Denies wheezing or cough.  GI: Denies dysphagia or odynophagia. Denies jaundice, hematemesis, fecal incontinence. GU : Denies urinary burning, urinary frequency,  urinary hesitancy MS: Denies joint pain, muscle weakness, cramps, or limitation of movement.  Derm: Denies rash, itching, dry skin Psych: Denies depression, anxiety, memory loss, and confusion Heme: Denies bruising, bleeding, and enlarged lymph nodes.   Physical Exam   BP 130/90 (BP Location: Left Arm, Patient Position: Sitting, Cuff Size: Normal)   Pulse 86   Temp 98.3 F (36.8 C) (Oral)   Ht 5\' 9"  (1.753 m)   Wt 182 lb 9.6 oz (82.8 kg)   SpO2 98%   BMI 26.97 kg/m  General:   Alert and oriented. Pleasant and cooperative. Well-nourished and well-developed.  Head:  Normocephalic and atraumatic. Eyes:  Without icterus Ears:  Normal auditory acuity. Lungs:  Clear to auscultation bilaterally.  Heart:  S1, S2 present  without murmurs appreciated.  Abdomen:  +BS, soft, non-tender and non-distended. No HSM noted. No guarding or rebound. No masses appreciated.  Rectal:  Deferred  Msk:  Symmetrical without gross deformities. Normal posture. Extremities:  Without edema. Neurologic:  Alert and  oriented x4;  grossly normal neurologically. Skin:  Intact without significant lesions or rashes. Psych:  Alert and cooperative. Normal mood and affect.   Assessment   Scott Villarreal is a 56 y.o. male presenting today with need for initial screening colonoscopy. He has no first-degree relatives with history of colon cancer or polyps. No alarm signs/symptoms   PLAN    Proceed with colonoscopy by Dr. 57  in near future: the risks, benefits, and alternatives have been discussed with the patient in detail. The patient states understanding and desires to proceed.   Further recommendations to follow  Marletta Lor, PhD, ANP-BC [facility]

## 2022-04-19 NOTE — Telephone Encounter (Signed)
Tried to call pt, LMOVM for return call. 

## 2022-04-22 NOTE — Telephone Encounter (Signed)
Letter mailed

## 2022-05-02 NOTE — Telephone Encounter (Signed)
Pt left VM. Called back, LMOVM

## 2022-06-11 DIAGNOSIS — Z125 Encounter for screening for malignant neoplasm of prostate: Secondary | ICD-10-CM | POA: Diagnosis not present

## 2022-06-11 DIAGNOSIS — E782 Mixed hyperlipidemia: Secondary | ICD-10-CM | POA: Diagnosis not present

## 2022-06-11 DIAGNOSIS — I10 Essential (primary) hypertension: Secondary | ICD-10-CM | POA: Diagnosis not present

## 2022-06-12 LAB — LIPID PANEL
Chol/HDL Ratio: 3.7 ratio (ref 0.0–5.0)
Cholesterol, Total: 222 mg/dL — ABNORMAL HIGH (ref 100–199)
HDL: 60 mg/dL (ref >39)
LDL Chol Calc (NIH): 116 mg/dL — ABNORMAL HIGH (ref 0–99)
Triglycerides: 267 mg/dL — ABNORMAL HIGH (ref 0–149)
VLDL Cholesterol Cal: 46 mg/dL — ABNORMAL HIGH (ref 5–40)

## 2022-06-12 LAB — BASIC METABOLIC PANEL
BUN/Creatinine Ratio: 16 (ref 9–20)
BUN: 20 mg/dL (ref 6–24)
CO2: 22 mmol/L (ref 20–29)
Calcium: 9.3 mg/dL (ref 8.7–10.2)
Chloride: 103 mmol/L (ref 96–106)
Creatinine, Ser: 1.23 mg/dL (ref 0.76–1.27)
Glucose: 104 mg/dL — ABNORMAL HIGH (ref 70–99)
Potassium: 4.7 mmol/L (ref 3.5–5.2)
Sodium: 138 mmol/L (ref 134–144)
eGFR: 68 mL/min/{1.73_m2} (ref >59)

## 2022-06-12 LAB — HEPATIC FUNCTION PANEL
ALT: 58 IU/L — ABNORMAL HIGH (ref 0–44)
AST: 30 IU/L (ref 0–40)
Albumin: 4.5 g/dL (ref 3.8–4.9)
Alkaline Phosphatase: 52 IU/L (ref 44–121)
Bilirubin Total: 0.4 mg/dL (ref 0.0–1.2)
Bilirubin, Direct: 0.13 mg/dL (ref 0.00–0.40)
Total Protein: 6.7 g/dL (ref 6.0–8.5)

## 2022-06-12 LAB — PSA: Prostate Specific Ag, Serum: 0.6 ng/mL (ref 0.0–4.0)

## 2022-06-12 LAB — HEMOGLOBIN A1C
Est. average glucose Bld gHb Est-mCnc: 120 mg/dL
Hgb A1c MFr Bld: 5.8 % — ABNORMAL HIGH (ref 4.8–5.6)

## 2022-06-13 ENCOUNTER — Other Ambulatory Visit: Payer: Self-pay | Admitting: *Deleted

## 2022-06-13 ENCOUNTER — Ambulatory Visit (INDEPENDENT_AMBULATORY_CARE_PROVIDER_SITE_OTHER): Payer: BC Managed Care – PPO | Admitting: Family Medicine

## 2022-06-13 VITALS — BP 140/88 | HR 73 | Temp 98.1°F | Ht 68.5 in | Wt 182.4 lb

## 2022-06-13 DIAGNOSIS — E782 Mixed hyperlipidemia: Secondary | ICD-10-CM

## 2022-06-13 DIAGNOSIS — Z0000 Encounter for general adult medical examination without abnormal findings: Secondary | ICD-10-CM | POA: Diagnosis not present

## 2022-06-13 DIAGNOSIS — R7309 Other abnormal glucose: Secondary | ICD-10-CM

## 2022-06-13 DIAGNOSIS — I10 Essential (primary) hypertension: Secondary | ICD-10-CM

## 2022-06-13 DIAGNOSIS — Z79899 Other long term (current) drug therapy: Secondary | ICD-10-CM

## 2022-06-13 MED ORDER — FLUOXETINE HCL 10 MG PO CAPS: 10.0000 mg | ORAL_CAPSULE | Freq: Every day | ORAL | 1 refills | Status: DC

## 2022-06-13 MED ORDER — LISINOPRIL 20 MG PO TABS: ORAL_TABLET | ORAL | 1 refills | Status: DC

## 2022-06-13 MED ORDER — ATORVASTATIN CALCIUM 40 MG PO TABS: 40.0000 mg | ORAL_TABLET | Freq: Every day | ORAL | 1 refills | Status: DC

## 2022-06-13 NOTE — Progress Notes (Signed)
   Subjective:    Patient ID: Scott Villarreal, male    DOB: Sep 11, 1966, 56 y.o.   MRN: [id number]  HPI  The patient comes in today for a wellness visit. He is in today for wellness He does state he could be doing a better job with dietary measures Also relates alcohol use is higher than what it should be he is trying to taper this down we did discuss minimizing sugary drinks as well   A review of their health history was completed.  A review of medications was also completed.  Any needed refills; Lisinopril  Eating habits: tries to eat good  Falls/  MVA accidents in past few months: No  Regular exercise: working  Specialist pt sees on regular basis: No  Preventative health issues were discussed.   Additional concerns: No  Review of Systems     Objective:   Physical Exam General-in no acute distress Eyes-no discharge Lungs-respiratory rate normal, CTA CV-no murmurs,RRR Extremities skin warm dry no edema Neuro grossly normal Behavior normal, alert  Prostate exam normal      Assessment & Plan:  1. Well adult exam Adult wellness-complete.wellness physical was conducted today. Importance of diet and exercise were discussed in detail.  Importance of stress reduction and healthy living were discussed.  In addition to this a discussion regarding safety was also covered.  We also reviewed over immunizations and gave recommendations regarding current immunization needed for age.   In addition to this additional areas were also touched on including: Preventative health exams needed:  Colonoscopy importance of a colonoscopy discussed in detail he states he was playing phone tag with the GI doctor I encouraged him to go by there with a list of dates that he is available and they should be able to help him  Patient was advised yearly wellness exam   2. Mixed hyperlipidemia Cholesterol healthy diet recommended continue medication  3. Essential hypertension, benign Blood  pressure fair control cut back on salt increase activity minimize alcohol-patient to check blood pressure periodically and send [place] updates  4. Elevated hemoglobin A1c He is prediabetic phase very important watch starches in the diet cut back on alcohol exercise more follow-up within 5 to 6 months

## 2022-06-13 NOTE — Patient Instructions (Signed)

## 2022-07-05 ENCOUNTER — Ambulatory Visit: Payer: Self-pay

## 2022-12-13 ENCOUNTER — Ambulatory Visit: Payer: BC Managed Care – PPO | Admitting: Family Medicine

## 2023-01-07 ENCOUNTER — Ambulatory Visit: Payer: BC Managed Care – PPO | Admitting: Family Medicine

## 2023-01-07 ENCOUNTER — Encounter: Payer: Self-pay | Admitting: Family Medicine

## 2023-01-07 VITALS — BP 136/72 | Wt 182.2 lb

## 2023-01-07 DIAGNOSIS — I10 Essential (primary) hypertension: Secondary | ICD-10-CM

## 2023-01-07 DIAGNOSIS — R748 Abnormal levels of other serum enzymes: Secondary | ICD-10-CM

## 2023-01-07 DIAGNOSIS — E782 Mixed hyperlipidemia: Secondary | ICD-10-CM | POA: Diagnosis not present

## 2023-01-07 DIAGNOSIS — R0789 Other chest pain: Secondary | ICD-10-CM

## 2023-01-07 DIAGNOSIS — R7309 Other abnormal glucose: Secondary | ICD-10-CM | POA: Diagnosis not present

## 2023-01-07 MED ORDER — DICLOFENAC SODIUM 75 MG PO TBEC
75.0000 mg | DELAYED_RELEASE_TABLET | Freq: Two times a day (BID) | ORAL | 0 refills | Status: AC
Start: 2023-01-07 — End: ?

## 2023-01-07 MED ORDER — FLUOXETINE HCL 10 MG PO CAPS
10.0000 mg | ORAL_CAPSULE | Freq: Every day | ORAL | 1 refills | Status: DC
Start: 2023-01-07 — End: 2023-11-24

## 2023-01-07 MED ORDER — ATORVASTATIN CALCIUM 40 MG PO TABS: 40.0000 mg | ORAL_TABLET | Freq: Every day | ORAL | 1 refills | Status: DC

## 2023-01-07 MED ORDER — LISINOPRIL 20 MG PO TABS
ORAL_TABLET | ORAL | 1 refills | Status: DC
Start: 2023-01-07 — End: 2023-11-24

## 2023-01-07 NOTE — Progress Notes (Signed)
   Subjective:    Patient ID: Scott Villarreal, male    DOB: 1966-02-08, 57 y.o.   MRN: [id number]  HPI  Pt arrives due to pain in center part of chest. Pt states it "pops" from left collarbone to center of chest. Has been keeping him up at night. No issues with breathing. Pt states that belching does relieve it. Going on for about a month.  Patient relates a sharp pain in the chest he feels a pop that occurs in the central portion near the sternum it creates very uncomfortable pain and discomfort that shoots across his chest and there are certain times when he bends or twists he feels pain similar to this he denies any numbing tingling denies shortness of breath denies substernal chest discomfort does not have any angina issues He feels that the this was triggered by lifting very heavy objects at work and he states every time he lifts heavy objects at work he gets more of the same pain He thinks it was triggered at work when he lifted a very heavy object-I told him he needs to talk to his supervisor  Patient for blood pressure check up.  The patient does have hypertension.   Patient relates dietary measures try to minimize salt The importance of healthy diet and activity were discussed Patient relates compliance  Patient here for follow-up regarding cholesterol.    Patient relates taking medication on a regular basis Denies problems with medication Importance of dietary measures discussed Regular lab work regarding lipid and liver was checked and if needing additional labs was appropriately ordered  Review of Systems     Objective:   Physical Exam Lungs are clear hearts regular tender's costochondral border on the left side when he laid back on the exam table he felt a pop in that area that caused significant pain and when he sat up he felt increased discomfort in that area   We did have a shared discussion regarding the possibility of doing lab work and x-rays patient defers  currently Patient will do his lab work in the near future regarding blood pressure     Assessment & Plan:   Musculoskeletal pain Recommend no heavy lifting for at least 2 weeks He needs to talk to his supervisor if they determined that this is caused by work they will send him to [employer]. for further evaluation If they determined that this was not triggered by his work then he could not file for [employer] and let us know Anti-inflammatory twice daily if it starts causing significant stomach pain discomfort or other problems to stop immediately  HTN- patient seen for follow-up regarding HTN.   Diet, medication compliance, appropriate labs and refills were completed.   Importance of keeping blood pressure under good control to lessen the risk of complications discussed Regular follow-up visits discussed  Hyperlipidemia-importance of diet, weight control, activity, compliance with medications discussed.   Recent labs reviewed.   Any additional labs or refills ordered.   Importance of keeping under good control discussed. Regular follow-up visits discussed Refills were sent in Patient to do lab work in the near future

## 2023-01-10 DIAGNOSIS — R7309 Other abnormal glucose: Secondary | ICD-10-CM | POA: Diagnosis not present

## 2023-01-10 DIAGNOSIS — I10 Essential (primary) hypertension: Secondary | ICD-10-CM | POA: Diagnosis not present

## 2023-01-10 DIAGNOSIS — E782 Mixed hyperlipidemia: Secondary | ICD-10-CM | POA: Diagnosis not present

## 2023-01-11 LAB — HEPATIC FUNCTION PANEL
ALT: 53 IU/L — ABNORMAL HIGH (ref 0–44)
AST: 31 IU/L (ref 0–40)
Albumin: 4.3 g/dL (ref 3.8–4.9)
Alkaline Phosphatase: 61 IU/L (ref 44–121)
Bilirubin Total: 0.4 mg/dL (ref 0.0–1.2)
Bilirubin, Direct: 0.11 mg/dL (ref 0.00–0.40)
Total Protein: 6.7 g/dL (ref 6.0–8.5)

## 2023-01-11 LAB — BASIC METABOLIC PANEL (7)
BUN/Creatinine Ratio: 13 (ref 9–20)
BUN: 14 mg/dL (ref 6–24)
CO2: 25 mmol/L (ref 20–29)
Chloride: 103 mmol/L (ref 96–106)
Creatinine, Ser: 1.07 mg/dL (ref 0.76–1.27)
Glucose: 106 mg/dL — ABNORMAL HIGH (ref 70–99)
Potassium: 5 mmol/L (ref 3.5–5.2)
Sodium: 141 mmol/L (ref 134–144)
eGFR: 81 mL/min/{1.73_m2} (ref >59)

## 2023-01-11 LAB — HEMOGLOBIN A1C
Est. average glucose Bld gHb Est-mCnc: 120 mg/dL
Hgb A1c MFr Bld: 5.8 % — ABNORMAL HIGH (ref 4.8–5.6)

## 2023-01-11 LAB — LIPID PANEL
Chol/HDL Ratio: 3.5 ratio (ref 0.0–5.0)
Cholesterol, Total: 209 mg/dL — ABNORMAL HIGH (ref 100–199)
HDL: 59 mg/dL (ref >39)
LDL Chol Calc (NIH): 127 mg/dL — ABNORMAL HIGH (ref 0–99)
Triglycerides: 133 mg/dL (ref 0–149)
VLDL Cholesterol Cal: 23 mg/dL (ref 5–40)

## 2023-01-14 NOTE — Addendum Note (Signed)
Addended by: Dairl Ponder on: 01/14/2023 11:11 AM   Modules accepted: Orders

## 2023-01-30 ENCOUNTER — Encounter: Payer: Self-pay | Admitting: Radiology

## 2023-01-31 ENCOUNTER — Ambulatory Visit (HOSPITAL_COMMUNITY)
Admission: RE | Admit: 2023-01-31 | Discharge: 2023-01-31 | Disposition: A | Discharge status: Home / Self Care | Payer: BC Managed Care – PPO | Source: Ambulatory Visit | Attending: Family Medicine | Admitting: Family Medicine

## 2023-01-31 DIAGNOSIS — R748 Abnormal levels of other serum enzymes: Secondary | ICD-10-CM | POA: Diagnosis not present

## 2023-01-31 DIAGNOSIS — R945 Abnormal results of liver function studies: Secondary | ICD-10-CM | POA: Diagnosis not present

## 2023-06-20 ENCOUNTER — Ambulatory Visit
Admission: RE | Admit: 2023-06-20 | Discharge: 2023-06-20 | Disposition: A | Discharge status: Home / Self Care | Payer: BC Managed Care – PPO | Source: Ambulatory Visit | Attending: Nurse Practitioner | Admitting: Nurse Practitioner

## 2023-06-20 VITALS — BP 128/84 | HR 75 | Temp 98.2°F | Resp 16

## 2023-06-20 DIAGNOSIS — Z1152 Encounter for screening for COVID-19: Secondary | ICD-10-CM

## 2023-06-20 DIAGNOSIS — B349 Viral infection, unspecified: Secondary | ICD-10-CM | POA: Insufficient documentation

## 2023-06-20 LAB — POCT INFLUENZA A/B
Influenza A, POC: NEGATIVE
Influenza B, POC: NEGATIVE

## 2023-06-20 MED ORDER — CETIRIZINE HCL 10 MG PO TABS: 10.0000 mg | ORAL_TABLET | Freq: Every day | ORAL | 0 refills | Status: DC

## 2023-06-20 MED ORDER — PSEUDOEPH-BROMPHEN-DM 30-2-10 MG/5ML PO SYRP: 5.0000 mL | ORAL_SOLUTION | Freq: Four times a day (QID) | ORAL | 0 refills | Status: DC | PRN

## 2023-06-20 MED ORDER — ONDANSETRON HCL 4 MG PO TABS: 4.0000 mg | ORAL_TABLET | Freq: Three times a day (TID) | ORAL | 0 refills | Status: DC | PRN

## 2023-06-20 MED ORDER — FLUTICASONE PROPIONATE 50 MCG/ACT NA SUSP: 2.0000 | Freq: Every day | NASAL | 0 refills | Status: DC

## 2023-06-20 NOTE — ED Triage Notes (Signed)
Pt c/o headache, body aches, diarrhea, dizziness, nausea, dry mouth, chills sweats, congestion, ear fullness, cough in the mornings  x 3 days

## 2023-06-20 NOTE — ED Provider Notes (Addendum)
[facility]    CSN: [id number] Arrival date & time: 06/20/23  1214      History   Chief Complaint Chief Complaint  Patient presents with   Headache    Upset stomach, sweating, body aches for the last 3 days - Entered by patient    HPI Scott Villarreal is a 57 y.o. male.   The history is provided by the patient.   The patient presents for a 3-day history of bodyaches, headaches, chills, sweats, nasal congestions, bilateral ear fullness, dizziness, nausea, diarrhea and dry mouth. Patient also reports cough that is present in the mornings. 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 He denies ear drainage, wheezing, SOB, chest pain, abdominal pain, or vomiting. The patient states he has pressure in his head and ears that comes and goes. He has been taking Advil for his symptoms. He denies any sick contacts.   Past Medical History:  Diagnosis Date   Erectile dysfunction    Hypercholesterolemia    Hypertension     Patient Active Problem List   Diagnosis Date Noted   Gastroenteritis 02/25/2022   Colon cancer screening 08/23/2020   Preventative health care 09/23/2019   Essential hypertension, benign 05/07/2013   Hyperlipidemia 05/07/2013    Past Surgical History:  Procedure Laterality Date   ELBOW SURGERY Left        Home Medications    Prior to Admission medications   Medication Sig Start Date End Date Taking? Authorizing Provider  atorvastatin (LIPITOR) 40 MG tablet Take 1 tablet (40 mg total) by mouth daily. 01/07/23   Babs Sciara, MD  diclofenac (VOLTAREN) 75 MG EC tablet Take 1 tablet (75 mg total) by mouth 2 (two) times daily. 01/07/23   Babs Sciara, MD  FLUoxetine (PROZAC) 10 MG capsule Take 1 capsule (10 mg total) by mouth daily. 01/07/23   Babs Sciara, MD  lisinopril (ZESTRIL) 20 MG tablet TAKE ONE (1) TABLET BY MOUTH EVERY DAY 01/07/23   Babs Sciara, MD  ondansetron (ZOFRAN-ODT) 4 MG disintegrating  tablet Take 1 tablet (4 mg total) by mouth every 8 (eight) hours as needed for nausea or vomiting. 02/22/22   Tommie Sams, DO  sildenafil (VIAGRA) 100 MG tablet Take 0.5-1 tablets (50-100 mg total) by mouth daily as needed for erectile dysfunction. 03/14/22   Babs Sciara, MD    Family History Family History  Problem Relation Age of Onset   Colon cancer Paternal Aunt    Hypertension Other    Colon polyps Neg Hx     Social History Social History   Tobacco Use   Smoking status: Former    Current packs/day: 0.00    Average packs/day: 2.0 packs/day for 25.0 years (50.0 ttl pk-yrs)    Types: Cigarettes    Start date: 08/17/1973    Quit date: 08/17/1998    Years since quitting: 24.8   Smokeless tobacco: Never  Substance Use Topics   Alcohol use: Yes    Alcohol/week: 14.0 standard drinks of alcohol    Types: 14 Cans of beer per week    Comment: 1 or 2 beers a day after work   Drug use: No     Allergies   Patient has no known allergies.   Review of Systems Review of Systems Per HPI  Physical Exam Triage Vital Signs ED Triage Vitals  Encounter Vitals Group     BP 06/20/23 1233 128/84  Systolic BP Percentile --      Diastolic BP Percentile --      Pulse Rate 06/20/23 1233 75     Resp 06/20/23 1233 16     Temp 06/20/23 1233 98.2 F (36.8 C)     Temp Source 06/20/23 1233 Oral     SpO2 06/20/23 1233 96 %     Weight --      Height --      Head Circumference --      Peak Flow --      Pain Score 06/20/23 1234 0     Pain Loc --      Pain Education --      Exclude from Growth Chart --    No data found.  Updated Vital Signs BP 128/84 (BP Location: Right Arm)   Pulse 75   Temp 98.2 F (36.8 C) (Oral)   Resp 16   SpO2 96%   Visual Acuity Right Eye Distance:   Left Eye Distance:   Bilateral Distance:    Right Eye Near:   Left Eye Near:    Bilateral Near:     Physical Exam Vitals and nursing note reviewed.  Constitutional:      General: He is not in  acute distress.    Appearance: He is well-developed.  HENT:     Head: Normocephalic.     Right Ear: Ear canal and external ear normal. A middle ear effusion is present.     Left Ear: Tympanic membrane, ear canal and external ear normal.     Nose: Congestion present.     Mouth/Throat:     Lips: Pink.     Mouth: Mucous membranes are moist.     Pharynx: Oropharynx is clear. Uvula midline. Posterior oropharyngeal erythema and postnasal drip present. No pharyngeal swelling, oropharyngeal exudate or uvula swelling.  Eyes:     Extraocular Movements: Extraocular movements intact.     Pupils: Pupils are equal, round, and reactive to light.  Cardiovascular:     Rate and Rhythm: Normal rate and regular rhythm.     Heart sounds: Normal heart sounds.  Pulmonary:     Effort: Pulmonary effort is normal.     Breath sounds: Normal breath sounds.  Abdominal:     General: Bowel sounds are normal.     Palpations: Abdomen is soft.     Tenderness: There is no abdominal tenderness.  Musculoskeletal:     Cervical back: Normal range of motion.  Lymphadenopathy:     Cervical: No cervical adenopathy.  Skin:    General: Skin is warm and dry.  Neurological:     Mental Status: He is alert and oriented to person, place, and time.     GCS: GCS eye subscore is 4. GCS verbal subscore is 5. GCS motor subscore is 6.  Psychiatric:        Mood and Affect: Mood normal.        Speech: Speech normal.        Behavior: Behavior normal.      UC Treatments / Results  Labs (all labs ordered are listed, but only abnormal results are displayed) Labs Reviewed  POCT INFLUENZA A/B    EKG   Radiology No results found.  Procedures Procedures (including critical care time)  Medications Ordered in UC Medications - No data to display  Initial Impression / Assessment and Plan / UC Course  I have reviewed the triage vital signs and the nursing notes.  Pertinent labs &  imaging results that were available during  my care of the patient were reviewed by me and considered in my medical decision making (see chart for details).  The patient is well-appearing, he is in NAD, vital signs are stable.   Influenza test was negative, COVID test is pending. He is a candidate to receive Paxlovid if the COVID test is positive (patient will need to hold atorvastatin for 2 weeks). Patient has been afebrile. Suspect symptoms are of viral etiology. Will provide symptomatic treatment with Bromfed DM for his cough, Fluticasone nasal spray for nasal congestion and right middle ear effusion, and cetirizine 10mg  tablet for nasal congestion. Ondansetron 4mg  ODT for nausea. Supportive care recommendations provided and discussed with the patient to include increasing fluids, allowing for plenty of rest, and a BRAT diet until nausea improves. Patient advised to follow-up with his PCP if symptoms do not improve, patient also given ER follow-up precautions. The patient is in agreement with this plan of care and verbalizes understanding, all questions were answered. The patient is stable for discharge.  Final Clinical Impressions(s) / UC Diagnoses   Final diagnoses:  None   Discharge Instructions   None    ED Prescriptions   None    PDMP not reviewed this encounter.   Abran Cantor, NP 06/20/23 1301    Abran Cantor, NP 06/20/23 1410

## 2023-06-20 NOTE — Discharge Instructions (Addendum)
The influenza test is negative. COVID test is pending. You will be contacted if the test result is positive. You can also access your results using MyChart. Take medication as prescribed. Increase fluids. Make sure you are drinking more than you typicially would in hot environments. You should drink at least 10 to 12 8 oz glasses of water daily. May take OTC Tylenol or Ibuprofen as needed for pain, fever or general discomfort. May use normal saline nasal spray throughout the day for nasal congestion and runny nose. For the cough, recommend using a humidifier at nighttime and sleeping elevated.  BRAT diet, bananas, rice, applesauce and toast for nausea. Go to the ER if you develop worsening dizziness, or develop new symptoms such as slurred speech, change in your mental status, or other concerns. Follow-up with your PCP if symptoms fail to improve.

## 2023-06-21 LAB — SARS CORONAVIRUS 2 (TAT 6-24 HRS): SARS Coronavirus 2: NEGATIVE

## 2023-07-15 ENCOUNTER — Other Ambulatory Visit: Payer: Self-pay

## 2023-07-15 ENCOUNTER — Emergency Department (HOSPITAL_COMMUNITY): Payer: Worker's Compensation

## 2023-07-15 ENCOUNTER — Encounter (HOSPITAL_COMMUNITY): Payer: Self-pay | Admitting: Pharmacy Technician

## 2023-07-15 ENCOUNTER — Emergency Department (HOSPITAL_COMMUNITY)
Admission: EM | Admit: 2023-07-15 | Discharge: 2023-07-15 | Disposition: A | Discharge status: Home / Self Care | Payer: Worker's Compensation | Attending: Emergency Medicine | Admitting: Emergency Medicine

## 2023-07-15 DIAGNOSIS — S22080A Wedge compression fracture of T11-T12 vertebra, initial encounter for closed fracture: Secondary | ICD-10-CM | POA: Insufficient documentation

## 2023-07-15 DIAGNOSIS — M545 Low back pain, unspecified: Secondary | ICD-10-CM | POA: Insufficient documentation

## 2023-07-15 DIAGNOSIS — S3993XA Unspecified injury of pelvis, initial encounter: Secondary | ICD-10-CM | POA: Diagnosis not present

## 2023-07-15 DIAGNOSIS — M549 Dorsalgia, unspecified: Secondary | ICD-10-CM | POA: Diagnosis not present

## 2023-07-15 DIAGNOSIS — S2242XA Multiple fractures of ribs, left side, initial encounter for closed fracture: Secondary | ICD-10-CM | POA: Diagnosis not present

## 2023-07-15 DIAGNOSIS — S20302A Unspecified superficial injuries of left front wall of thorax, initial encounter: Secondary | ICD-10-CM | POA: Diagnosis present

## 2023-07-15 DIAGNOSIS — W010XXA Fall on same level from slipping, tripping and stumbling without subsequent striking against object, initial encounter: Secondary | ICD-10-CM | POA: Insufficient documentation

## 2023-07-15 DIAGNOSIS — I700 Atherosclerosis of aorta: Secondary | ICD-10-CM | POA: Diagnosis not present

## 2023-07-15 DIAGNOSIS — S199XXA Unspecified injury of neck, initial encounter: Secondary | ICD-10-CM | POA: Diagnosis not present

## 2023-07-15 DIAGNOSIS — Z79899 Other long term (current) drug therapy: Secondary | ICD-10-CM | POA: Insufficient documentation

## 2023-07-15 DIAGNOSIS — R110 Nausea: Secondary | ICD-10-CM | POA: Insufficient documentation

## 2023-07-15 DIAGNOSIS — S0990XA Unspecified injury of head, initial encounter: Secondary | ICD-10-CM | POA: Diagnosis not present

## 2023-07-15 DIAGNOSIS — S299XXA Unspecified injury of thorax, initial encounter: Secondary | ICD-10-CM | POA: Diagnosis not present

## 2023-07-15 DIAGNOSIS — S3992XA Unspecified injury of lower back, initial encounter: Secondary | ICD-10-CM | POA: Diagnosis not present

## 2023-07-15 DIAGNOSIS — W19XXXA Unspecified fall, initial encounter: Secondary | ICD-10-CM

## 2023-07-15 DIAGNOSIS — I10 Essential (primary) hypertension: Secondary | ICD-10-CM | POA: Diagnosis not present

## 2023-07-15 DIAGNOSIS — S3991XA Unspecified injury of abdomen, initial encounter: Secondary | ICD-10-CM | POA: Diagnosis not present

## 2023-07-15 LAB — CBC
HCT: 40.4 % (ref 39.0–52.0)
Hemoglobin: 13.3 g/dL (ref 13.0–17.0)
MCH: 30.2 pg (ref 26.0–34.0)
MCHC: 32.9 g/dL (ref 30.0–36.0)
MCV: 91.6 fL (ref 80.0–100.0)
Platelets: 243 10*3/uL (ref 150–400)
RBC: 4.41 MIL/uL (ref 4.22–5.81)
RDW: 13.2 % (ref 11.5–15.5)
WBC: 7.6 10*3/uL (ref 4.0–10.5)
nRBC: 0 % (ref 0.0–0.2)

## 2023-07-15 LAB — COMPREHENSIVE METABOLIC PANEL
ALT: 41 U/L (ref 0–44)
AST: 29 U/L (ref 15–41)
Albumin: 3.8 g/dL (ref 3.5–5.0)
Alkaline Phosphatase: 46 U/L (ref 38–126)
Anion gap: 8 (ref 5–15)
BUN: 16 mg/dL (ref 6–20)
CO2: 24 mmol/L (ref 22–32)
Calcium: 8.7 mg/dL — ABNORMAL LOW (ref 8.9–10.3)
Chloride: 104 mmol/L (ref 98–111)
Creatinine, Ser: 1.13 mg/dL (ref 0.61–1.24)
GFR, Estimated: >60 mL/min (ref >60)
Glucose, Bld: 107 mg/dL — ABNORMAL HIGH (ref 70–99)
Potassium: 4 mmol/L (ref 3.5–5.1)
Sodium: 136 mmol/L (ref 135–145)
Total Bilirubin: 0.4 mg/dL (ref 0.3–1.2)
Total Protein: 6.6 g/dL (ref 6.5–8.1)

## 2023-07-15 MED ORDER — HYDROMORPHONE HCL 1 MG/ML IJ SOLN
1.0000 mg | Freq: Once | INTRAMUSCULAR | Status: AC
  Administered 2023-07-15: 1 mg via INTRAVENOUS
  Filled 2023-07-15: qty 1

## 2023-07-15 MED ORDER — ONDANSETRON HCL 4 MG/2ML IJ SOLN
4.0000 mg | Freq: Once | INTRAMUSCULAR | Status: AC
  Administered 2023-07-15: 4 mg via INTRAVENOUS
  Filled 2023-07-15: qty 2

## 2023-07-15 MED ORDER — IOHEXOL 300 MG/ML  SOLN
100.0000 mL | Freq: Once | INTRAMUSCULAR | Status: AC | PRN
  Administered 2023-07-15: 100 mL via INTRAVENOUS

## 2023-07-15 MED ORDER — HYDROCODONE-ACETAMINOPHEN 5-325 MG PO TABS
1.0000 | ORAL_TABLET | Freq: Four times a day (QID) | ORAL | 0 refills | Status: DC | PRN
Start: 2023-07-15 — End: 2023-11-03

## 2023-07-15 NOTE — ED Triage Notes (Signed)
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 Pt reports tank approx 1000lb. Tank landed on chest and abdomen. Pt complains of lower back pain.

## 2023-07-15 NOTE — ED Notes (Signed)
Incentive spirometer given with instructions

## 2023-07-15 NOTE — ED Provider Notes (Signed)
[place] [facility] DEPARTMENT AT [facility] Provider Note   CSN: [id number] Arrival date & time: 07/15/23  1234     History  Chief Complaint  Patient presents with   Trauma    Scott Villarreal is a 57 y.o. male.   Trauma   Current symptoms:      Associated symptoms:            Reports back pain and nausea.   Patient presents after a fall.  Medical history includes HLD, HTN.  Prior to arrival, patient was unloading a large tank off of his truck.  As he was doing so, the ramp gave out and patient fell backwards to the ground.  Estimated height of fall was 4 feet.  The large tank fell on top of him.  He endorses bilateral low back pain and mild nausea.  He denies any sensation of numbness or weakness.    Home Medications Prior to Admission medications   Medication Sig Start Date End Date Taking? Authorizing Provider  atorvastatin (LIPITOR) 40 MG tablet Take 1 tablet (40 mg total) by mouth daily. 01/07/23   Babs Sciara, MD  brompheniramine-pseudoephedrine-DM 30-2-10 MG/5ML syrup Take 5 mLs by mouth 4 (four) times daily as needed. 06/20/23   Leath-Warren, Sadie Haber, NP  cetirizine (ZYRTEC) 10 MG tablet Take 1 tablet (10 mg total) by mouth daily. 06/20/23   Leath-Warren, Sadie Haber, NP  diclofenac (VOLTAREN) 75 MG EC tablet Take 1 tablet (75 mg total) by mouth 2 (two) times daily. 01/07/23   Babs Sciara, MD  FLUoxetine (PROZAC) 10 MG capsule Take 1 capsule (10 mg total) by mouth daily. 01/07/23   Babs Sciara, MD  fluticasone (FLONASE) 50 MCG/ACT nasal spray Place 2 sprays into both nostrils daily. 06/20/23   Leath-Warren, Sadie Haber, NP  lisinopril (ZESTRIL) 20 MG tablet TAKE ONE (1) TABLET BY MOUTH EVERY DAY 01/07/23   Babs Sciara, MD  ondansetron (ZOFRAN) 4 MG tablet Take 1 tablet (4 mg total) by mouth every 8 (eight) hours as needed for nausea or vomiting. 06/20/23   Leath-Warren, Sadie Haber, NP  sildenafil (VIAGRA) 100 MG tablet Take 0.5-1 tablets (50-100 mg total) by  mouth daily as needed for erectile dysfunction. 03/14/22   Babs Sciara, MD      Allergies    Patient has no known allergies.    Review of Systems   Review of Systems  Gastrointestinal:  Positive for nausea.  Musculoskeletal:  Positive for back pain.  All other systems reviewed and are negative.   Physical Exam Updated Vital Signs BP (!) 140/80   Pulse 68   Temp 98 F (36.7 C)   Resp 20   Ht 5' 8.5" (1.74 m)   Wt 81.6 kg   SpO2 98%   BMI 26.97 kg/m  Physical Exam Vitals and nursing note reviewed.  Constitutional:      General: He is not in acute distress.    Appearance: Normal appearance. He is well-developed. He is not ill-appearing, toxic-appearing or diaphoretic.  HENT:     Head: Normocephalic and atraumatic.     Right Ear: External ear normal.     Left Ear: External ear normal.     Nose: Nose normal.     Mouth/Throat:     Mouth: Mucous membranes are moist.  Eyes:     Extraocular Movements: Extraocular movements intact.     Conjunctiva/sclera: Conjunctivae normal.  Neck:     Comments: Cervical collar in place  Cardiovascular:     Rate and Rhythm: Normal rate and regular rhythm.     Heart sounds: No murmur heard. Pulmonary:     Effort: Pulmonary effort is normal. No respiratory distress.     Breath sounds: Normal breath sounds. No wheezing or rales.  Chest:     Chest wall: No tenderness.  Abdominal:     General: There is no distension.     Palpations: Abdomen is soft.     Tenderness: There is no abdominal tenderness.  Musculoskeletal:        General: No swelling or deformity. Normal range of motion.     Cervical back: Neck supple.     Right lower leg: No edema.     Left lower leg: No edema.  Skin:    General: Skin is warm and dry.     Coloration: Skin is not jaundiced or pale.  Neurological:     General: No focal deficit present.     Mental Status: He is alert and oriented to person, place, and time.  Psychiatric:        Mood and Affect: Mood  normal.        Behavior: Behavior normal.     ED Results / Procedures / Treatments   Labs (all labs ordered are listed, but only abnormal results are displayed) Labs Reviewed  COMPREHENSIVE METABOLIC PANEL - Abnormal; Notable for the following components:      Result Value   Glucose, Bld 107 (*)    Calcium 8.7 (*)    All other components within normal limits  CBC  URINALYSIS, ROUTINE W REFLEX MICROSCOPIC  I-STAT CHEM 8, ED    EKG None  Radiology No results found.  Procedures Procedures    Medications Ordered in ED Medications  HYDROmorphone (DILAUDID) injection 1 mg (1 mg Intravenous Given 07/15/23 1352)  ondansetron (ZOFRAN) injection 4 mg (4 mg Intravenous Given 07/15/23 1352)  iohexol (OMNIPAQUE) 300 MG/ML solution 100 mL (100 mLs Intravenous Contrast Given 07/15/23 1559)    ED Course/ Medical Decision Making/ A&P                                 Medical Decision Making Amount and/or Complexity of Data Reviewed Labs: ordered. Radiology: ordered.  Risk Prescription drug management.   This patient presents to the ED for concern of fall, this involves an extensive number of treatment options, and is a complaint that carries with it a high risk of complications and morbidity.  The differential diagnosis includes acute injuries   Co morbidities that complicate the patient evaluation  HTN, HLD   Additional history obtained:  Additional history obtained from N/A External records from outside source obtained and reviewed including EMR   Lab Tests:  I Ordered, and personally interpreted labs.  The pertinent results include: Normal kidney function, normal electrolytes.  Normal hemoglobin, no leukocytosis   Imaging Studies ordered:  I ordered imaging studies including CT of head, C-spine, chest, abdomen, pelvis, T-spine, L-spine I independently visualized and interpreted imaging which showed this pending at time of signout) I agree with the radiologist  interpretation   Cardiac Monitoring: / EKG:  The patient was maintained on a cardiac monitor.  I personally viewed and interpreted the cardiac monitored which showed an underlying rhythm of: Sinus rhythm   Problem List / ED Course / Critical interventions / Medication management  Patient presents after a fall.  This occurred shortly prior  to arrival.  Patient describes a 4 foot high loading dock giving out from under him causing him to fall on the ground, landing on his back.  As he did so, a large heavy tank fell onto his abdomen and chest.  He has pain in his bilateral lower back but denies any other areas of discomfort.  Vital signs are normal on arrival.  Patient was given Dilaudid and Zofran for symptomatic relief.  Trauma workup was initiated.  Lab results are unremarkable.  CT imaging is pending at time of signout.  Care of patient was signed out to oncoming ED provider. I ordered medication including Dilaudid for analgesia; Zofran for nausea Reevaluation of the patient after these medicines showed that the patient improved I have reviewed the patients home medicines and have made adjustments as needed   Social Determinants of Health:  Has PCP        Final Clinical Impression(s) / ED Diagnoses Final diagnoses:  Fall, initial encounter    Rx / DC Orders ED Discharge Orders     None         Gloris Manchester, MD 07/15/23 1604

## 2023-07-15 NOTE — ED Provider Notes (Signed)
Patient turned over to me for accident that occurred at work.  Where he was crushed.  CT trauma pan scan shows evidence of left fourth and fifth nondisplaced rib fractures and T12 compression fracture approximately 20%.  Without significant retropulsion of fragments.  Patient without any neurological deficits.  Patient was treated with incentive spirometer.  Treated with hydrocodone for pain patient is already followed by orthopedics Dr. Dallas Schimke.  He will make an appointment.  Work note provided.  This was on the job injury.   Vanetta Mulders, MD 07/15/23 [phone]

## 2023-07-15 NOTE — Discharge Instructions (Signed)
Call orthopedics for follow-up.  Work note provided to be out of work for a week.  Take the hydrocodone as needed for pain relief.  If not taking the hydrocodone you can take extra strength Tylenol.  Use the incentive spirometer to keep your lungs working well to prevent pneumonia from the left fourth and fifth rib fractures.  Also there was evidence of a T12 compression fracture.  Return for any new or worse symptoms.

## 2023-07-16 ENCOUNTER — Telehealth: Payer: Self-pay | Admitting: Orthopedic Surgery

## 2023-07-16 NOTE — Telephone Encounter (Signed)
He was seen in the ED 8/13, Dr. Rexene Edison was on call.  He has multiple rib fx's and T12 compression fx.  It is WC, I have spoken w/the spouse, the pt was sleeping.  She knew nothing about WC claims #'s or who the carrier was.  I have given her Tisha's name and # to call and told her she'd help walk her through everything she needs and we'll go from there.

## 2023-07-22 ENCOUNTER — Telehealth: Payer: Self-pay

## 2023-07-22 NOTE — Transitions of Care (Post Inpatient/ED Visit) (Signed)
07/22/2023  Name: Scott Villarreal MRN: [id number] DOB: 02/06/1966  Today's TOC FU Call Status: Today's TOC FU Call Status:: Successful TOC FU Call Completed TOC FU Call Complete Date: 07/22/23  Red on EMMI-ED Discharge Alert Date & Reason:07/17/23 "Scheduled follow-up appt? No","Have d/c instructions? No"  Transition Care Management Follow-up Telephone Call Date of Discharge: 07/15/23 [facility] Type of Discharge: Emergency Department Reason for ED Visit: Other: ("fall,initial encounter") How have you been since you were released from the hospital?: Better (Pt states today is the best he has felt so far-was able to get out of bed & move aound a little bit more." Most painful thing continues to be "standing up"-taking pain meds-was having some consitipation-started taking Fibercon. LBM two days ago.) Any questions or concerns?: No  Items Reviewed: Did you receive and understand the discharge instructions provided?: Yes (states wife has paperwork) Medications obtained,verified, and reconciled?: Yes (Medications Reviewed) Any new allergies since your discharge?: No Dietary orders reviewed?: Yes Type of Diet Ordered:: low salt/heart healthy Do you have support at home?: Yes People in Home: spouse Name of Support/Comfort Primary Source: Marylene Land  Medications Reviewed Today: Medications Reviewed Today     Reviewed by Charlyn Minerva, RN (Registered Nurse) on 07/22/23 at 1539  Med List Status: <None>   Medication Order Taking? Sig Documenting Provider Last Dose Status Informant  acetaminophen (TYLENOL) 500 MG tablet [id number] Yes Take 1,000 mg by mouth every 6 (six) hours as needed for mild pain. [provider] Taking Active Self  atorvastatin (LIPITOR) 40 MG tablet [id number] Yes Take 1 tablet (40 mg total) by mouth daily. Babs Sciara, MD Taking Active   brompheniramine-pseudoephedrine-DM 30-2-10 MG/5ML syrup [id number] Yes Take 5 mLs by mouth  4 (four) times daily as needed. Leath-Warren, Sadie Haber, NP Taking Active   cetirizine (ZYRTEC) 10 MG tablet [id number] Yes Take 1 tablet (10 mg total) by mouth daily. Leath-Warren, Sadie Haber, NP Taking Active   diclofenac (VOLTAREN) 75 MG EC tablet [id number] Yes Take 1 tablet (75 mg total) by mouth 2 (two) times daily. Babs Sciara, MD Taking Active   FLUoxetine (PROZAC) 10 MG capsule [id number] Yes Take 1 capsule (10 mg total) by mouth daily. Babs Sciara, MD Taking Active   fluticasone (FLONASE) 50 MCG/ACT nasal spray [id number] Yes Place 2 sprays into both nostrils daily. Leath-Warren, Sadie Haber, NP Taking Active   HYDROcodone-acetaminophen (NORCO/VICODIN) 5-325 MG tablet [id number] Yes Take 1 tablet by mouth every 6 (six) hours as needed for moderate pain. Vanetta Mulders, MD Taking Active   lisinopril (ZESTRIL) 20 MG tablet [id number] Yes TAKE ONE (1) TABLET BY MOUTH EVERY DAY Luking, [name], MD Taking Active   ondansetron (ZOFRAN) 4 MG tablet [id number] Yes Take 1 tablet (4 mg total) by mouth every 8 (eight) hours as needed for nausea or vomiting. Leath-Warren, Sadie Haber, NP Taking Active   polycarbophil (FIBERCON) 625 MG tablet [id number] Yes Take 625 mg by mouth as needed for mild constipation. [provider] Taking Active Self  sildenafil (VIAGRA) 100 MG tablet [id number] Yes Take 0.5-1 tablets (50-100 mg total) by mouth daily as needed for erectile dysfunction. Babs Sciara, MD Taking Active             Home Care and Equipment/Supplies: Were Home Health Services Ordered?: NA Any new equipment or medical supplies ordered?: NA  Functional Questionnaire: Do you need assistance with bathing/showering or dressing?: No Do you need assistance with meal preparation?: No Do you  need assistance with eating?: No Do you have difficulty maintaining continence: No Do you need assistance with getting out of bed/getting out of a chair/moving?: No Do you have difficulty managing  or taking your medications?: No  Follow up appointments reviewed: PCP Follow-up appointment confirmed?: No (Pt will discuss with wife as she has been arranging his appts) MD Provider Line [phone] Given: No Specialist Hospital Follow-up appointment confirmed?: No Reason Specialist Follow-Up Not Confirmed: Patient has Specialist Provider Number and will Call for Appointment (Pt will discuss with wife when she gets home from work-states she has called and spoken with some offices-but unsure about appts) Do you need transportation to your follow-up appointment?: No Do you understand care options if your condition(s) worsen?: Yes-patient verbalized understanding  SDOH Interventions Today    Flowsheet Row Most Recent Value  SDOH Interventions   Food Insecurity Interventions Intervention Not Indicated  Transportation Interventions Intervention Not Indicated       TOC Interventions Today    Flowsheet Row Most Recent Value  TOC Interventions   TOC Interventions Discussed/Reviewed TOC Interventions Discussed, Post discharge activity limitations per provider      Interventions Today    Flowsheet Row Most Recent Value  General Interventions   General Interventions Discussed/Reviewed General Interventions Discussed, Doctor Visits  Doctor Visits Discussed/Reviewed Doctor Visits Discussed, PCP, Specialist  PCP/Specialist Visits Compliance with follow-up visit  Education Interventions   Education Provided Provided Education  Provided Verbal Education On Nutrition, Medication, When to see the doctor, Other  [pain mgmt, bowel mgmt]  Nutrition Interventions   Nutrition Discussed/Reviewed Nutrition Discussed  Pharmacy Interventions   Pharmacy Dicussed/Reviewed Pharmacy Topics Discussed, Medications and their functions  Safety Interventions   Safety Discussed/Reviewed Home Safety, Safety Discussed       Alessandra Grout [facility] Care Management Care  Management Community Coordinator Direct Phone: [phone] Toll Free: [phone] Fax: [phone]

## 2023-07-22 NOTE — Transitions of Care (Post Inpatient/ED Visit) (Signed)
   07/22/2023  Name: Geordie Hake MRN: [id number] DOB: Dec 16, 1965  Today's TOC FU Call Status: Today's TOC FU Call Status:: Unsuccessful Call (1st Attempt) Unsuccessful Call (1st Attempt) Date: 07/22/23  Attempted to reach the patient regarding the most recent Inpatient/ED visit.  Follow Up Plan: Additional outreach attempts will be made to reach the patient to complete the Transitions of Care (Post Inpatient/ED visit) call.    Antionette Fairy, RN,BSN,CCM [facility] Care Management Care Management Community Coordinator Direct Phone: [phone] Toll Free: [phone] Fax: [phone]

## 2023-09-23 ENCOUNTER — Other Ambulatory Visit: Payer: Self-pay | Admitting: Orthopedic Surgery

## 2023-09-23 DIAGNOSIS — M542 Cervicalgia: Secondary | ICD-10-CM

## 2023-09-27 ENCOUNTER — Inpatient Hospital Stay
Admission: RE | Admit: 2023-09-27 | Discharge: 2023-09-27 | Disposition: A | Discharge status: Home / Self Care | Payer: Self-pay | Source: Ambulatory Visit | Attending: Orthopedic Surgery | Admitting: Orthopedic Surgery

## 2023-09-27 DIAGNOSIS — M542 Cervicalgia: Secondary | ICD-10-CM

## 2023-10-22 ENCOUNTER — Encounter: Payer: Self-pay | Admitting: Family Medicine

## 2023-10-22 ENCOUNTER — Ambulatory Visit: Payer: Self-pay | Admitting: Family Medicine

## 2023-10-22 NOTE — Telephone Encounter (Signed)
  Chief Complaint: HTN, asymptomatic Symptoms: Denies Frequency: today Pertinent Negatives: Patient denies CP, SOB, vision changes, HA Disposition: [x] ED /[] Urgent Care (no appt availability in office) / [x] Appointment(In office/virtual)/ []  [facility]/ [] Home Care/ [x] Refused Recommended Disposition /[] [facility]/ []  Follow-up with PCP Additional Notes: Pt had BP checked today when at appt for shot, was told BP was high, see numbers below. Pt advised to seek ED treatment, declines. Willing to see PCP, scheduled for 11/22. States he isn't taking his BP meds like suppose to.   Copied from CRM [id number]. Topic: Clinical - Red Word Triage >> Oct 22, 2023  4:36 PM Cassiday T wrote: Red Word that prompted transfer to Nurse Triage: pt is 163/111 Reason for Disposition  [1] Systolic BP  >= 160 OR Diastolic >= 100 AND [2] cardiac (e.g., breathing difficulty, chest pain) or neurologic symptoms (e.g., new-onset blurred or double vision, unsteady gait)  Answer Assessment - Initial Assessment Questions 1. BLOOD PRESSURE: "What is the blood pressure?" "Did you take at least two measurements 5 minutes apart?"     142/113, 154/108, 162/111.  Manually and automatic 2. ONSET: "When did you take your blood pressure?"     Lunch time 3. HOW: "How did you take your blood pressure?" (e.g., automatic home BP monitor, visiting nurse)     Automatic and manual 4. HISTORY: "Do you have a history of high blood pressure?"     Yes 5. MEDICINES: "Are you taking any medicines for blood pressure?" "Have you missed any doses recently?"     Unsure, admits skips days 6. OTHER SYMPTOMS: "Do you have any symptoms?" (e.g., blurred vision, chest pain, difficulty breathing, headache, weakness)     denies  Protocols used: Blood Pressure - High-A-AH

## 2023-10-23 NOTE — Telephone Encounter (Signed)
Please see previous message regarding this Since patient has schedule an appointment with Dr. Adriana Simas it would be fine to follow-up with Dr. Adriana Simas and then he can do his lab work down the road and do a standard follow-up visit at that time

## 2023-10-23 NOTE — Telephone Encounter (Signed)
Nurses Patient needs to be seen before 4 December open slot preferably with myself Lab work before that visit including metabolic 7, lipid, liver, PSA, urine ACR, A1c Diagnosis hypertension hyperlipidemia, prediabetes, prostate cancer screening  Patient needs to make sure he stays compliant with his medicine Healthy diet minimizing salt intake Fit in some walking on a regular basis Avoid alcohol If any problems before then follow-up here urgent care or ER

## 2023-10-23 NOTE — Telephone Encounter (Signed)
Patient has already moved his appointment to tomorrow with Dr Adriana Simas

## 2023-10-24 ENCOUNTER — Ambulatory Visit: Payer: BC Managed Care – PPO | Admitting: Family Medicine

## 2023-10-24 VITALS — BP 160/102 | HR 87 | Temp 97.7°F | Ht 68.5 in | Wt 187.0 lb

## 2023-10-24 DIAGNOSIS — I10 Essential (primary) hypertension: Secondary | ICD-10-CM | POA: Diagnosis not present

## 2023-10-24 MED ORDER — AMLODIPINE BESYLATE 5 MG PO TABS: 5.0000 mg | ORAL_TABLET | Freq: Every day | ORAL | 1 refills | Status: DC

## 2023-10-24 NOTE — Assessment & Plan Note (Signed)
Uncontrolled.  Recent worsening.  Continue lisinopril.  Adding amlodipine.  Follow-up in 2 weeks to 1 month.

## 2023-10-24 NOTE — Patient Instructions (Signed)
Continue lisinopril.  Adding amlodipine.  Follow-up in 2 weeks to 1 month.  Take care  Dr. Adriana Simas

## 2023-10-24 NOTE — Progress Notes (Signed)
Subjective:  Patient ID: Scott Villarreal, male    DOB: 04/11/1966  Age: 57 y.o. MRN: [id number]  CC:   Chief Complaint  Patient presents with   Hypertension    Had an accident at work , 1,000 lb cylynder tube fell on him and is being treated with workmens comp , needs BP to be lower for injections    HPI:  57 year old male presents for evaluation of hypertension.  Blood pressures are elevated here and at home.  He is compliant with lisinopril 20 mg daily.  He states that he is in need of an injection via orthopedics but cannot have this done until his blood pressure is well-controlled.  He is otherwise feeling well.  No other complaints at this time.  Patient Active Problem List   Diagnosis Date Noted   Gastroenteritis 02/25/2022   Colon cancer screening 08/23/2020   Preventative health care 09/23/2019   Essential hypertension, benign 05/07/2013   Hyperlipidemia 05/07/2013    Social Hx   Social History   Socioeconomic History   Marital status: Married    Spouse name: Not on file   Number of children: Not on file   Years of education: Not on file   Highest education level: Not on file  Occupational History   Not on file  Tobacco Use   Smoking status: Former    Current packs/day: 0.00    Average packs/day: 2.0 packs/day for 25.0 years (50.0 ttl pk-yrs)    Types: Cigarettes    Start date: 08/17/1973    Quit date: 08/17/1998    Years since quitting: 25.2   Smokeless tobacco: Never  Substance and Sexual Activity   Alcohol use: Yes    Alcohol/week: 14.0 standard drinks of alcohol    Types: 14 Cans of beer per week    Comment: 1 or 2 beers a day after work   Drug use: No   Sexual activity: Yes  Other Topics Concern   Not on file  Social History Narrative   Not on file   Social Determinants of Health   Financial Resource Strain: Not on file  Food Insecurity: No Food Insecurity (07/22/2023)   Hunger Vital Sign    Worried About Running Out of Food in the Last Year: Never  true    Ran Out of Food in the Last Year: Never true  Transportation Needs: No Transportation Needs (07/22/2023)   PRAPARE - Administrator, Civil Service (Medical): No    Lack of Transportation (Non-Medical): No  Physical Activity: Not on file  Stress: Not on file  Social Connections: Unknown (04/14/2022)   [facility] from [facility]   Social Network    Social Network: Not on file    Review of Systems Per HPI  Objective:  BP (!) 160/102   Pulse 87   Temp 97.7 F (36.5 C)   Ht 5' 8.5" (1.74 m)   Wt 187 lb (84.8 kg)   SpO2 98%   BMI 28.02 kg/m      10/24/2023    9:23 AM 10/24/2023    8:59 AM 07/15/2023    5:00 PM  BP/Weight  Systolic BP 160 169 130  Diastolic BP 102 98 65  Wt. (Lbs)  187   BMI  28.02 kg/m2     Physical Exam Vitals and nursing note reviewed.  Constitutional:      General: He is not in acute distress.    Appearance: Normal appearance.  HENT:  Head: Normocephalic and atraumatic.  Cardiovascular:     Rate and Rhythm: Normal rate and regular rhythm.  Pulmonary:     Effort: Pulmonary effort is normal.     Breath sounds: Normal breath sounds. No wheezing, rhonchi or rales.  Neurological:     Mental Status: He is alert.  Psychiatric:        Mood and Affect: Mood normal.        Behavior: Behavior normal.     Lab Results  Component Value Date   WBC 7.6 07/15/2023   HGB 13.3 07/15/2023   HCT 40.4 07/15/2023   PLT 243 07/15/2023   GLUCOSE 107 (H) 07/15/2023   CHOL 209 (H) 01/10/2023   TRIG 133 01/10/2023   HDL 59 01/10/2023   LDLCALC 127 (H) 01/10/2023   ALT 41 07/15/2023   AST 29 07/15/2023   NA 136 07/15/2023   K 4.0 07/15/2023   CL 104 07/15/2023   CREATININE 1.13 07/15/2023   BUN 16 07/15/2023   CO2 24 07/15/2023   HGBA1C 5.8 (H) 01/10/2023     Assessment & Plan:   Problem List Items Addressed This Visit       Cardiovascular and Mediastinum   Essential hypertension, benign - Primary     Uncontrolled.  Recent worsening.  Continue lisinopril.  Adding amlodipine.  Follow-up in 2 weeks to 1 month.      Relevant Medications   amLODipine (NORVASC) 5 MG tablet    Meds ordered this encounter  Medications   amLODipine (NORVASC) 5 MG tablet    Sig: Take 1 tablet (5 mg total) by mouth daily.    Dispense:  90 tablet    Refill:  1    Follow-up:  Return for HTN follow up. 2 weeks to 1 month  Vernestine Brodhead DO [facility]

## 2023-11-03 ENCOUNTER — Ambulatory Visit
Admission: RE | Admit: 2023-11-03 | Discharge: 2023-11-03 | Disposition: A | Discharge status: Home / Self Care | Payer: BC Managed Care – PPO | Source: Ambulatory Visit | Attending: Family Medicine | Admitting: Family Medicine

## 2023-11-03 VITALS — BP 124/88 | HR 107 | Temp 98.1°F | Resp 16

## 2023-11-03 DIAGNOSIS — J069 Acute upper respiratory infection, unspecified: Secondary | ICD-10-CM | POA: Diagnosis not present

## 2023-11-03 LAB — POC COVID19/FLU A&B COMBO
Covid Antigen, POC: NEGATIVE
Influenza A Antigen, POC: NEGATIVE
Influenza B Antigen, POC: NEGATIVE

## 2023-11-03 MED ORDER — HYDROCODONE BIT-HOMATROP MBR 5-1.5 MG/5ML PO SOLN: 5.0000 mL | Freq: Four times a day (QID) | ORAL | 0 refills | Status: DC | PRN

## 2023-11-03 NOTE — ED Triage Notes (Signed)
Pt reports cough headache and sore throat x 1 day, chest congestion, nasal congestion, states he was cleaning up leave the day before sx's occurred.

## 2023-11-03 NOTE — ED Provider Notes (Signed)
[facility]   [id number] 11/03/23 Arrival Time: 0911  ASSESSMENT & PLAN:  1. Viral URI with cough    Discussed typical duration of likely viral illness. Results for orders placed or performed during the hospital encounter of 11/03/23  POC Covid + Flu A/B Antigen  Result Value Ref Range   Influenza A Antigen, POC Negative Negative   Influenza B Antigen, POC Negative Negative   Covid Antigen, POC Negative Negative   OTC symptom care as needed.  New Prescriptions   HYDROCODONE BIT-HOMATROPINE (HYCODAN) 5-1.5 MG/5ML SYRUP    Take 5 mLs by mouth every 6 (six) hours as needed for cough.     Discharge Instructions      Be aware, your cough medication may cause drowsiness. Please do not drive, operate heavy machinery or make important decisions while on this medication, it can cloud your judgement.        Follow-up Information     Luking, Scott Coup, MD.   Specialty: Family Medicine Why: As needed. Contact information: [address] [place] [address] [phone]                 Reviewed expectations re: course of current medical issues. Questions answered. Outlined signs and symptoms indicating need for more acute intervention. Understanding verbalized. After Visit Summary given.   SUBJECTIVE: History from: Patient. Scott Villarreal is a 57 y.o. male. Reports: HA, ST, chest and nasal congestion, "bad cough"; abrupt onset yesterday; feels same today. Denies: fever and difficulty breathing. Normal PO intake without n/v/d.  OBJECTIVE:  Vitals:   11/03/23 1014  BP: 124/88  Pulse: (!) 107  Resp: 16  Temp: 98.1 F (36.7 C)  TempSrc: Oral  SpO2: 94%    Recheck P: 98 General appearance: alert; no distress Eyes: PERRLA; EOMI; conjunctiva normal HENT: [place]; AT; with nasal congestion Neck: supple  Lungs: speaks full sentences without difficulty; unlabored; CTAB Extremities: no edema Skin: warm and dry Neurologic: normal  gait Psychological: alert and cooperative; normal mood and affect  Labs: Results for orders placed or performed during the hospital encounter of 11/03/23  POC Covid + Flu A/B Antigen  Result Value Ref Range   Influenza A Antigen, POC Negative Negative   Influenza B Antigen, POC Negative Negative   Covid Antigen, POC Negative Negative   Labs Reviewed  POC COVID19/FLU A&B COMBO    Imaging: No results found.  No Known Allergies  Past Medical History:  Diagnosis Date   Erectile dysfunction    Hypercholesterolemia    Hypertension    Social History   Socioeconomic History   Marital status: Married    Spouse name: Not on file   Number of children: Not on file   Years of education: Not on file   Highest education level: Not on file  Occupational History   Not on file  Tobacco Use   Smoking status: Former    Current packs/day: 0.00    Average packs/day: 2.0 packs/day for 25.0 years (50.0 ttl pk-yrs)    Types: Cigarettes    Start date: 08/17/1973    Quit date: 08/17/1998    Years since quitting: 25.2   Smokeless tobacco: Never  Substance and Sexual Activity   Alcohol use: Yes    Alcohol/week: 14.0 standard drinks of alcohol    Types: 14 Cans of beer per week    Comment: 1 or 2 beers a day after work   Drug use: No   Sexual activity: Yes  Other Topics  Concern   Not on file  Social History Narrative   Not on file   Social Determinants of Health   Financial Resource Strain: Not on file  Food Insecurity: No Food Insecurity (07/22/2023)   Hunger Vital Sign    Worried About Running Out of Food in the Last Year: Never true    Ran Out of Food in the Last Year: Never true  Transportation Needs: No Transportation Needs (07/22/2023)   PRAPARE - Administrator, Civil Service (Medical): No    Lack of Transportation (Non-Medical): No  Physical Activity: Not on file  Stress: Not on file  Social Connections: Unknown (04/14/2022)   Received from [facility]   Social Network    Social Network: Not on file  Intimate Partner Violence: Unknown (03/06/2022)   Received from [facility]   HITS    Physically Hurt: Not on file    Insult or Talk Down To: Not on file    Threaten Physical Harm: Not on file    Scream or Curse: Not on file   Family History  Problem Relation Age of Onset   Colon cancer Paternal Aunt    Hypertension Other    Colon polyps Neg Hx    Past Surgical History:  Procedure Laterality Date   ELBOW SURGERY Left      Scott Layman, MD 11/03/23 1047

## 2023-11-03 NOTE — Discharge Instructions (Signed)
Be aware, your cough medication may cause drowsiness. Please do not drive, operate heavy machinery or make important decisions while on this medication, it can cloud your judgement.  

## 2023-11-24 ENCOUNTER — Encounter (INDEPENDENT_AMBULATORY_CARE_PROVIDER_SITE_OTHER): Payer: Self-pay | Admitting: *Deleted

## 2023-11-24 ENCOUNTER — Ambulatory Visit: Payer: BC Managed Care – PPO | Admitting: Family Medicine

## 2023-11-24 VITALS — BP 118/78 | Wt 187.0 lb

## 2023-11-24 DIAGNOSIS — Z125 Encounter for screening for malignant neoplasm of prostate: Secondary | ICD-10-CM

## 2023-11-24 DIAGNOSIS — R7303 Prediabetes: Secondary | ICD-10-CM

## 2023-11-24 DIAGNOSIS — Z1211 Encounter for screening for malignant neoplasm of colon: Secondary | ICD-10-CM

## 2023-11-24 DIAGNOSIS — E782 Mixed hyperlipidemia: Secondary | ICD-10-CM | POA: Diagnosis not present

## 2023-11-24 DIAGNOSIS — I10 Essential (primary) hypertension: Secondary | ICD-10-CM

## 2023-11-24 DIAGNOSIS — Z79899 Other long term (current) drug therapy: Secondary | ICD-10-CM

## 2023-11-24 MED ORDER — LISINOPRIL 20 MG PO TABS: ORAL_TABLET | ORAL | 1 refills | Status: DC

## 2023-11-24 MED ORDER — ATORVASTATIN CALCIUM 40 MG PO TABS: 40.0000 mg | ORAL_TABLET | Freq: Every day | ORAL | 1 refills | Status: DC

## 2023-11-24 NOTE — Progress Notes (Signed)
   Subjective:    Patient ID: Scott Villarreal, male    DOB: 03/03/1966, 57 y.o.   MRN: [id number]  HPI  Patient for blood pressure check up.  The patient does have hypertension.   Patient relates dietary measures try to minimize salt The importance of healthy diet and activity were discussed Patient relates compliance  Patient relates that the added blood pressure medicine caused him to feel lightheaded and his blood pressure dropped he felt too much so he stopped taking the amlodipine he is trying to watch diet stay active  We did discuss the importance of doing a colonoscopy. Patient here for follow-up regarding cholesterol.    Patient relates taking medication on a regular basis Denies problems with medication Importance of dietary measures discussed Regular lab work regarding lipid and liver was checked and if needing additional labs was appropriately ordered   Review of Systems     Objective:   Physical Exam  General-in no acute distress Eyes-no discharge Lungs-respiratory rate normal, CTA CV-no murmurs,RRR Extremities skin warm dry no edema Neuro grossly normal Behavior normal, alert       Assessment & Plan:  HTN- patient seen for follow-up regarding HTN.   Diet, medication compliance, appropriate labs and refills were completed.   Importance of keeping blood pressure under good control to lessen the risk of complications discussed Regular follow-up visits discussed  Hyperlipidemia-importance of diet, weight control, activity, compliance with medications discussed.   Recent labs reviewed.   Any additional labs or refills ordered.   Importance of keeping under good control discussed. Regular follow-up visits discussed  1. Essential hypertension, benign (Primary) BP good control see above - Microalbumin/Creatinine Ratio, Urine - Basic Metabolic Panel  2. Mixed hyperlipidemia Check lipid profile continue statin - Lipid Panel  3. Screening for prostate  cancer Screening PSA - PSA  4. Prediabetes Healthy diet minimize portions keep weight down  5. High risk medication use Liver function ordered - Hepatic Function Panel  6. Screening for colon cancer Patient needs colonoscopy patient states previously he never heard anything I told him if he does not hear anything in the next 2 to 3 weeks notify us - Ambulatory referral to Gastroenterology

## 2023-12-17 ENCOUNTER — Telehealth: Payer: Self-pay | Admitting: Family Medicine

## 2023-12-17 NOTE — Telephone Encounter (Signed)
  I received a MyChart message from Shelvy Dickens his wife The following message was sent back to her through the nurses Nurses I am certainly sympathetic to what is going on with Merlyn Starring  Please send Shelvy Dickens a message that it would be best that we go ahead and schedule him an office visit-you have my permission to fit him in within the next 2 weeks  At that visit we will discuss how things go on it would be highly important for Shelvy Dickens to attend at that same visit

## 2023-12-22 DIAGNOSIS — Z125 Encounter for screening for malignant neoplasm of prostate: Secondary | ICD-10-CM | POA: Diagnosis not present

## 2023-12-22 DIAGNOSIS — E782 Mixed hyperlipidemia: Secondary | ICD-10-CM | POA: Diagnosis not present

## 2023-12-22 DIAGNOSIS — I10 Essential (primary) hypertension: Secondary | ICD-10-CM | POA: Diagnosis not present

## 2023-12-22 DIAGNOSIS — Z79899 Other long term (current) drug therapy: Secondary | ICD-10-CM | POA: Diagnosis not present

## 2023-12-23 LAB — HEPATIC FUNCTION PANEL
ALT: 46 [IU]/L — ABNORMAL HIGH (ref 0–44)
AST: 24 [IU]/L (ref 0–40)
Albumin: 4.2 g/dL (ref 3.8–4.9)
Alkaline Phosphatase: 62 [IU]/L (ref 44–121)
Bilirubin Total: 0.4 mg/dL (ref 0.0–1.2)
Bilirubin, Direct: 0.12 mg/dL (ref 0.00–0.40)
Total Protein: 6.6 g/dL (ref 6.0–8.5)

## 2023-12-23 LAB — MICROALBUMIN / CREATININE URINE RATIO
Creatinine, Urine: 251.5 mg/dL
Microalb/Creat Ratio: 9 mg/g{creat} (ref 0–29)
Microalbumin, Urine: 22.3 ug/mL

## 2023-12-23 LAB — BASIC METABOLIC PANEL
BUN/Creatinine Ratio: 13 (ref 9–20)
BUN: 17 mg/dL (ref 6–24)
CO2: 25 mmol/L (ref 20–29)
Calcium: 9.4 mg/dL (ref 8.7–10.2)
Chloride: 100 mmol/L (ref 96–106)
Creatinine, Ser: 1.26 mg/dL (ref 0.76–1.27)
Glucose: 93 mg/dL (ref 70–99)
Potassium: 4.7 mmol/L (ref 3.5–5.2)
Sodium: 138 mmol/L (ref 134–144)
eGFR: 66 mL/min/{1.73_m2} (ref >59)

## 2023-12-23 LAB — LIPID PANEL
Chol/HDL Ratio: 5.3 {ratio} — ABNORMAL HIGH (ref 0.0–5.0)
Cholesterol, Total: 296 mg/dL — ABNORMAL HIGH (ref 100–199)
HDL: 56 mg/dL (ref >39)
LDL Chol Calc (NIH): 175 mg/dL — ABNORMAL HIGH (ref 0–99)
Triglycerides: 336 mg/dL — ABNORMAL HIGH (ref 0–149)
VLDL Cholesterol Cal: 65 mg/dL — ABNORMAL HIGH (ref 5–40)

## 2023-12-23 LAB — PSA: Prostate Specific Ag, Serum: 0.8 ng/mL (ref 0.0–4.0)

## 2023-12-25 ENCOUNTER — Ambulatory Visit: Payer: BC Managed Care – PPO | Admitting: Family Medicine

## 2023-12-25 VITALS — Ht 68.5 in

## 2023-12-25 DIAGNOSIS — F09 Unspecified mental disorder due to known physiological condition: Secondary | ICD-10-CM | POA: Diagnosis not present

## 2023-12-25 DIAGNOSIS — R413 Other amnesia: Secondary | ICD-10-CM

## 2023-12-25 DIAGNOSIS — F439 Reaction to severe stress, unspecified: Secondary | ICD-10-CM | POA: Diagnosis not present

## 2023-12-25 DIAGNOSIS — Z789 Other specified health status: Secondary | ICD-10-CM

## 2023-12-25 MED ORDER — SERTRALINE HCL 50 MG PO TABS: 50.0000 mg | ORAL_TABLET | Freq: Every day | ORAL | 3 refills | Status: AC

## 2023-12-25 NOTE — Progress Notes (Signed)
12/25/2023    3:29 PM 11/24/2023    1:35 PM  GAD 7 : Generalized Anxiety Score  Nervous, Anxious, on Edge 2 0  Control/stop worrying 2 0  Worry too much - different things 2 1  Trouble relaxing 1 1  Restless 2 0  Easily annoyed or irritable 2 0  Afraid - awful might happen 0 1  Total GAD 7 Score 11 3  Anxiety Difficulty Somewhat difficult Somewhat difficult    Flowsheet Row Office Visit from 12/25/2023 in [facility]  PHQ-9 Total Score 12      Discussed the use of AI scribe software for clinical note transcription with the patient, who gave verbal consent to proceed.  History of Present Illness   The patient, accompanied by his partner, presents with concerns about cognitive changes and increased irritability, particularly in the evenings. The patient's partner reports that the patient has become more forgetful, often unable to recall details of events or conversations. The patient acknowledges these memory lapses, noting difficulty remembering work-related tasks and locations, despite having been at the same job for ten years. The patient denies any issues with misplacing items or recognizing familiar people.  The patient's family history is significant for dementia and Parkinson's disease, affecting both the patient's father and grandfather. The patient's partner expresses concern about the patient's symptoms, given this family history.  The patient also reports increased alcohol consumption, with a daily intake of six to seven shots of liquor and three beers, typically starting around three in the afternoon. The patient acknowledges the need to reduce alcohol consumption and expresses a desire to gradually taper down.  The patient's medical history includes a traumatic event involving a fall, which has led to increased anxiety and nightmares. The patient's partner believes this event may have exacerbated the patient's cognitive symptoms.  The patient  denies any issues with physical coordination or balance, and no tremors or other Parkinsonian symptoms are reported. The patient's partner, however, notes increased evening jitteriness, reminiscent of the patient's father's Parkinson's symptoms.  The patient's partner also mentions a recent diagnosis of fatty liver disease, which the patient attributes to his alcohol consumption. The patient acknowledges the need to address this health issue, along with his high cholesterol levels.  In summary, the patient presents with cognitive changes, increased irritability, and increased alcohol consumption against a backdrop of a strong family history of dementia and Parkinson's disease. The patient acknowledges the need to address these issues and expresses a willingness to gradually reduce alcohol consumption.      General-in no acute distress Eyes-no discharge Lungs-respiratory rate normal, CTA CV-no murmurs,RRR Extremities skin warm dry no edema Neuro grossly normal Behavior normal, alert Minimal tremor noted in his index fingers bilateral.  He does not have any shuffling gait no flat facies  Assessment and Plan    Cognitive Impairment Patient reports difficulty with memory and cognitive function, particularly in the evenings. Family history of dementia and Parkinson's disease. No significant issues with misplacing items, getting lost, or forgetting familiar people. -Order blood work to check for markers related to dementia. -Order MRI of the brain to assess for atrophy or evidence of mini-strokes.  Alcohol Use Disorder Patient reports significant daily alcohol consumption (5-6 shots of liquor and 3 beers daily) starting in the afternoon. Patient expresses desire to reduce alcohol intake. -Advise patient to gradually reduce alcohol intake over the next several weeks, reducing by one drink every 4-5 days. -Discuss potential for outpatient counseling  or inpatient treatment if progress is not  made. -Consider medication to suppress desire to drink once alcohol intake is reduced.  Anxiety/PTSD Patient reports increased anxiety and nightmares since a traumatic event. -Start low dose of an SSRI to help manage anxiety and PTSD symptoms.  Hyperlipidemia Patient's cholesterol is significantly elevated. -Advise patient to reduce intake of high cholesterol foods.  Follow-up in 2-3 weeks to assess progress with alcohol reduction and response to SSRI.      1. Poor memory (Primary) On his follow-up visit we will do a more formal examination he did pass the clock drawing test but could only recall 1 out of 3 items with prompting - Vitamin B12 - Folate - TSH + free T4 - MR Brain W Wo Contrast  2. Alcohol use Patient utilizes more alcohol than he should.  He has the equivalent of 5 or 6 shots a night along with 5 or 6 beers we discussed warning signs for DTs also discussed the importance of gradually tapering I did give him the option of inpatient detox as well as intensive outpatient counseling patient defers on both he will do lab work he will do follow-up visit as planned - Vitamin B12 - Folate - TSH + free T4 - MR Brain W Wo Contrast  3. Stress He has significant stressors along with mild irritability anxiousness and occasional depression symptoms not suicidal patient is open to utilizing medication half dose per day for the first week then 1/day thereafter to follow-up if progressive troubles or worse recheck again in approximately 2 to 3 weeks - Vitamin B12 - Folate - TSH + free T4 - MR Brain W Wo Contrast Patient not suicidal  4. Cognitive dysfunction Significant cognitive dysfunction with short-term memory issues harder time processing patient is safe to drive but patient was cautioned never to drive with alcohol also caution to stick with familiar areas and follow-up in a few weeks time  Because of the his memory progressive loss over the last 3 months as well as  difficulty thinking things through it is important to do MRI to rule out the possibility of mini strokes - Vitamin B12 - Folate - TSH + free T4 - MR Brain W Wo Contrast  He does have a strong family history of Parkinson's as well as dementia may need neurology consultation await the follow-up  In regards to alcohol use issues if he does not make significant improvement in regards to what is going on over the course the next several weeks we will again discuss intensive outpatient management or inpatient detox

## 2023-12-31 DIAGNOSIS — F09 Unspecified mental disorder due to known physiological condition: Secondary | ICD-10-CM | POA: Diagnosis not present

## 2023-12-31 DIAGNOSIS — R413 Other amnesia: Secondary | ICD-10-CM | POA: Diagnosis not present

## 2023-12-31 DIAGNOSIS — Z789 Other specified health status: Secondary | ICD-10-CM | POA: Diagnosis not present

## 2023-12-31 DIAGNOSIS — F439 Reaction to severe stress, unspecified: Secondary | ICD-10-CM | POA: Diagnosis not present

## 2024-01-01 ENCOUNTER — Encounter: Payer: Self-pay | Admitting: Family Medicine

## 2024-01-01 LAB — VITAMIN B12: Vitamin B-12: 453 pg/mL (ref 232–1245)

## 2024-01-01 LAB — TSH+FREE T4
Free T4: 1.1 ng/dL (ref 0.82–1.77)
TSH: 1.27 u[IU]/mL (ref 0.450–4.500)

## 2024-01-01 LAB — FOLATE: Folate: 11.2 ng/mL (ref >3.0)

## 2024-01-02 ENCOUNTER — Ambulatory Visit (HOSPITAL_COMMUNITY)
Admission: RE | Admit: 2024-01-02 | Discharge: 2024-01-02 | Disposition: A | Discharge status: Home / Self Care | Payer: BC Managed Care – PPO | Source: Ambulatory Visit | Attending: Family Medicine | Admitting: Family Medicine

## 2024-01-02 DIAGNOSIS — F439 Reaction to severe stress, unspecified: Secondary | ICD-10-CM | POA: Insufficient documentation

## 2024-01-02 DIAGNOSIS — F09 Unspecified mental disorder due to known physiological condition: Secondary | ICD-10-CM | POA: Insufficient documentation

## 2024-01-02 DIAGNOSIS — Z789 Other specified health status: Secondary | ICD-10-CM | POA: Insufficient documentation

## 2024-01-02 DIAGNOSIS — G3189 Other specified degenerative diseases of nervous system: Secondary | ICD-10-CM | POA: Diagnosis not present

## 2024-01-02 DIAGNOSIS — R413 Other amnesia: Secondary | ICD-10-CM | POA: Diagnosis not present

## 2024-01-02 MED ORDER — GADOBUTROL 1 MMOL/ML IV SOLN
8.4000 mL | Freq: Once | INTRAVENOUS | Status: AC | PRN
  Administered 2024-01-02: 8.4 mL via INTRAVENOUS

## 2024-01-05 ENCOUNTER — Encounter: Payer: Self-pay | Admitting: Family Medicine

## 2024-01-08 ENCOUNTER — Ambulatory Visit: Payer: BC Managed Care – PPO | Admitting: Family Medicine

## 2024-01-08 VITALS — BP 128/84 | HR 82 | Temp 98.4°F | Ht 68.5 in | Wt 186.0 lb

## 2024-01-08 DIAGNOSIS — F09 Unspecified mental disorder due to known physiological condition: Secondary | ICD-10-CM | POA: Diagnosis not present

## 2024-01-08 DIAGNOSIS — R413 Other amnesia: Secondary | ICD-10-CM

## 2024-01-08 NOTE — Progress Notes (Addendum)
   Subjective:    Patient ID: Scott Villarreal, male    DOB: March 06, 1966, 58 y.o.   MRN: [id number]  Discussed the use of AI scribe software for clinical note transcription with the patient, who gave verbal consent to proceed.  History of Present Illness   Scott Villarreal is a 58 year old male with mild cognitive impairment who presents for follow-up on cognitive function and alcohol use.  He has mild cognitive impairment with no significant changes in cognitive function over the past few weeks. He experiences difficulties with short-term memory and learning new tasks, particularly with new technology at work. He has been practicing with new devices to improve his proficiency.  He is currently tapering his alcohol consumption, having reduced to three drinks per day, with plans to decrease to two next week. No cravings for alcohol, and his mood has been stable and improved.  An MRI showed mild atrophy greater than expected for his age and chronic small vessel ischemia. A comprehensive blood panel showed normal thyroid  function and folate levels, but a B12 level that could be improved.  He experiences soreness in his neck and back, which he attributes to a previous injury. He is currently on light duty at work due to this injury. He mentions that his vertebrae remain in a different position than the others, which has been described as his 'new batch.'         Review of Systems     Objective:    Physical Exam        General-in no acute distress Eyes-no discharge Lungs-respiratory rate normal, CTA CV-no murmurs,RRR Extremities skin warm dry no edema Neuro grossly normal Behavior normal, alert        Assessment & Plan:  Assessment and Plan    Alcohol Use Disorder Progress in reducing alcohol consumption, with no reported cravings. Discussed the importance of complete cessation for cognitive health and overall wellbeing. -Continue tapering alcohol consumption with the goal of complete  cessation in 6-8 weeks.  Mild Cognitive Impairment Short-term memory loss noted on cognitive testing. MRI showed mild atrophy greater than expected for age and chronic small vessel ischemia. Discussed the role of healthy habits in slowing progression. -Start over-the-counter B12 supplement (1000 micrograms daily) to boost B12 level and support brain function. -Consider consultation with neurology and neuropsychology if cognitive decline progresses. -Follow-up in 4 months to assess cognitive status and progress with alcohol cessation.  General Health Maintenance -Continue to control blood pressure and cholesterol levels to reduce risk of further small vessel ischemia.      Greater than 30 minutes spent with patient under this evaluation He does have some memory loss short-term memory issues.  Also has some minimal cognitive issues He is safe to continue to work and drive  We did discuss doing lab work to look at markers for Alzheimer's as well as consultation with neurologist and neuropsychologist We also discussed getting totally away from alcohol He will let us  know if he is unsuccessful over the next 8 weeks Consider medication if unsuccessful Patient defers going to neurology currently we will set up follow-up in 4 months time to monitor progression  We did discuss how Aricept and Namenda can slow memory loss but long-term prognosis does not have appreciable change

## 2024-02-22 ENCOUNTER — Telehealth: Admitting: Physician Assistant

## 2024-02-22 DIAGNOSIS — R6889 Other general symptoms and signs: Secondary | ICD-10-CM

## 2024-02-22 MED ORDER — OSELTAMIVIR PHOSPHATE 75 MG PO CAPS: 75.0000 mg | ORAL_CAPSULE | Freq: Two times a day (BID) | ORAL | 0 refills | Status: AC

## 2024-02-22 MED ORDER — BENZONATATE 100 MG PO CAPS: 100.0000 mg | ORAL_CAPSULE | Freq: Three times a day (TID) | ORAL | 0 refills | Status: DC | PRN

## 2024-02-22 NOTE — Progress Notes (Signed)
 Virtual Visit Consent   Scott Villarreal, you are scheduled for a virtual visit with a [facility] provider today. Just as with appointments in the office, your consent must be obtained to participate. Your consent will be active for this visit and any virtual visit you may have with one of our providers in the next 365 days. If you have a MyChart account, a copy of this consent can be sent to you electronically.  As this is a virtual visit, video technology does not allow for your provider to perform a traditional examination. This may limit your provider's ability to fully assess your condition. If your provider identifies any concerns that need to be evaluated in person or the need to arrange testing (such as labs, EKG, etc.), we will make arrangements to do so. Although advances in technology are sophisticated, we cannot ensure that it will always work on either your end or our end. If the connection with a video visit is poor, the visit may have to be switched to a telephone visit. With either a video or telephone visit, we are not always able to ensure that we have a secure connection.  By engaging in this virtual visit, you consent to the provision of healthcare and authorize for your insurance to be billed (if applicable) for the services provided during this visit. Depending on your insurance coverage, you may receive a charge related to this service.  I need to obtain your verbal consent now. Are you willing to proceed with your visit today? Scott Villarreal has provided verbal consent on 02/22/2024 for a virtual visit (video or telephone). Tylene Fantasia Ward, PA-C  Date: 02/22/2024 12:14 PM   Virtual Visit via Video Note   I, Tylene Fantasia Ward, connected with  Scott Villarreal  ([id number], 06/24/1966) on 02/22/24 at 11:00 AM EDT by a video-enabled telemedicine application and verified that I am speaking with the correct person using two identifiers.  Location: Patient: Virtual Visit Location Patient:  Home Provider: Virtual Visit Location Provider: Home Office   I discussed the limitations of evaluation and management by telemedicine and the availability of in person appointments. The patient expressed understanding and agreed to proceed.    History of Present Illness: Scott Villarreal is a 58 y.o. who identifies as a male who was assigned male at birth, and is being seen today for cough, headache, congestion.  He also reports fever of 101.4.  Reports symptoms started about 48 hours ago.  Denies shortness of breath or wheezing.  He is taking Ibuprofen with temporary relief.  Reports wife is sick with similar sx.  HPI: HPI  Problems:  Patient Active Problem List   Diagnosis Date Noted   Gastroenteritis 02/25/2022   Colon cancer screening 08/23/2020   Preventative health care 09/23/2019   Essential hypertension, benign 05/07/2013   Hyperlipidemia 05/07/2013    Allergies: No Known Allergies Medications:  Current Outpatient Medications:    acetaminophen (TYLENOL) 500 MG tablet, Take 1,000 mg by mouth every 6 (six) hours as needed for mild pain., Disp: , Rfl:    atorvastatin (LIPITOR) 40 MG tablet, Take 1 tablet (40 mg total) by mouth daily., Disp: 90 tablet, Rfl: 1   benzonatate (TESSALON) 100 MG capsule, Take 1 capsule (100 mg total) by mouth 3 (three) times daily as needed., Disp: 20 capsule, Rfl: 0   diclofenac (VOLTAREN) 75 MG EC tablet, Take 1 tablet (75 mg total) by mouth 2 (two) times daily., Disp: 30 tablet, Rfl: 0   lisinopril (ZESTRIL)  20 MG tablet, TAKE ONE (1) TABLET BY MOUTH EVERY DAY, Disp: 90 tablet, Rfl: 1   meloxicam (MOBIC) 15 MG tablet, Take 15 mg by mouth daily., Disp: , Rfl:    oseltamivir (TAMIFLU) 75 MG capsule, Take 1 capsule (75 mg total) by mouth 2 (two) times daily for 5 days., Disp: 10 capsule, Rfl: 0   polycarbophil (FIBERCON) 625 MG tablet, Take 625 mg by mouth as needed for mild constipation., Disp: , Rfl:    sertraline (ZOLOFT) 50 MG tablet, Take 1 tablet (50 mg  total) by mouth daily., Disp: 30 tablet, Rfl: 3   sildenafil (VIAGRA) 100 MG tablet, Take 0.5-1 tablets (50-100 mg total) by mouth daily as needed for erectile dysfunction., Disp: 10 tablet, Rfl: 4   traMADol (ULTRAM) 50 MG tablet, Take 50-100 mg by mouth 3 (three) times daily as needed., Disp: , Rfl:   Observations/Objective: Patient is well-developed, well-nourished in no acute distress.  Resting comfortably at home.  Head is normocephalic, atraumatic.  No labored breathing.  Speech is clear and coherent with logical content.  Patient is alert and oriented at baseline.    Assessment and Plan: 1. Flu-like symptoms (Primary)  Advised pt to take home flu and covid test.  Will send in tamiflu to start if positive for flu.  Supportive care discussed.  In person evaluation precautions discussed.   Follow Up Instructions: I discussed the assessment and treatment plan with the patient. The patient was provided an opportunity to ask questions and all were answered. The patient agreed with the plan and demonstrated an understanding of the instructions.  A copy of instructions were sent to the patient via MyChart unless otherwise noted below.     The patient was advised to call back or seek an in-person evaluation if the symptoms worsen or if the condition fails to improve as anticipated.    Tylene Fantasia Ward, PA-C

## 2024-02-22 NOTE — Patient Instructions (Signed)
 Scott Villarreal, thank you for joining Tylene Fantasia Ward, PA-C for today's virtual visit.  While this provider is not your primary care provider (PCP), if your PCP is located in our provider database this encounter information will be shared with them immediately following your visit.   A [facility] MyChart account gives you access to today's visit and all your visits, tests, and labs performed at [facility] " click here if you don't have a [facility] MyChart account or go to [email or web address]  Consent: (Patient) Scott Villarreal provided verbal consent for this virtual visit at the beginning of the encounter.  Current Medications:  Current Outpatient Medications:    benzonatate (TESSALON) 100 MG capsule, Take 1 capsule (100 mg total) by mouth 3 (three) times daily as needed., Disp: 20 capsule, Rfl: 0   oseltamivir (TAMIFLU) 75 MG capsule, Take 1 capsule (75 mg total) by mouth 2 (two) times daily for 5 days., Disp: 10 capsule, Rfl: 0   acetaminophen (TYLENOL) 500 MG tablet, Take 1,000 mg by mouth every 6 (six) hours as needed for mild pain., Disp: , Rfl:    atorvastatin (LIPITOR) 40 MG tablet, Take 1 tablet (40 mg total) by mouth daily., Disp: 90 tablet, Rfl: 1   diclofenac (VOLTAREN) 75 MG EC tablet, Take 1 tablet (75 mg total) by mouth 2 (two) times daily., Disp: 30 tablet, Rfl: 0   lisinopril (ZESTRIL) 20 MG tablet, TAKE ONE (1) TABLET BY MOUTH EVERY DAY, Disp: 90 tablet, Rfl: 1   meloxicam (MOBIC) 15 MG tablet, Take 15 mg by mouth daily., Disp: , Rfl:    polycarbophil (FIBERCON) 625 MG tablet, Take 625 mg by mouth as needed for mild constipation., Disp: , Rfl:    sertraline (ZOLOFT) 50 MG tablet, Take 1 tablet (50 mg total) by mouth daily., Disp: 30 tablet, Rfl: 3   sildenafil (VIAGRA) 100 MG tablet, Take 0.5-1 tablets (50-100 mg total) by mouth daily as needed for erectile dysfunction., Disp: 10 tablet, Rfl: 4   traMADol (ULTRAM) 50 MG tablet, Take 50-100 mg by mouth 3 (three)  times daily as needed., Disp: , Rfl:    Medications ordered in this encounter:  Meds ordered this encounter  Medications   oseltamivir (TAMIFLU) 75 MG capsule    Sig: Take 1 capsule (75 mg total) by mouth 2 (two) times daily for 5 days.    Dispense:  10 capsule    Refill:  0    Supervising Provider:   Merrilee Jansky [[id number]]   benzonatate (TESSALON) 100 MG capsule    Sig: Take 1 capsule (100 mg total) by mouth 3 (three) times daily as needed.    Dispense:  20 capsule    Refill:  0    Supervising Provider:   Merrilee Jansky [[id number]]     *If you need refills on other medications prior to your next appointment, please contact your pharmacy*  Follow-Up: Call back or seek an in-person evaluation if the symptoms worsen or if the condition fails to improve as anticipated.  [facility] [phone]  Other Instructions  Continue with coricidin for congestion.  Can use flonase as well.  Recommend continued tylenol or ibuprofen as needed for fever and headache. Please take a home COVD/Flu test.  If Flu is positive initiate Tamiflu.  If COVID positive please follow up with [place] for further recommendations.   If you have been instructed to have an in-person evaluation today at a local Urgent Care facility, please use the  link below. It will take you to a list of all of our available [facility], including address, phone number and hours of operation. Please do not delay care.  [facility]  If you or a family member do not have a primary care provider, use the link below to schedule a visit and establish care. When you choose a [facility] primary care physician or advanced practice provider, you gain a long-term partner in health. Find a Primary Care Provider  Learn more about [facility]'s in-office and virtual care options:  - Get Care Now

## 2024-02-23 ENCOUNTER — Ambulatory Visit

## 2024-02-24 ENCOUNTER — Ambulatory Visit

## 2024-02-25 ENCOUNTER — Ambulatory Visit (INDEPENDENT_AMBULATORY_CARE_PROVIDER_SITE_OTHER)

## 2024-02-25 ENCOUNTER — Ambulatory Visit
Admission: RE | Admit: 2024-02-25 | Discharge: 2024-02-25 | Disposition: A | Discharge status: Home / Self Care | Source: Ambulatory Visit | Attending: Family Medicine | Admitting: Family Medicine

## 2024-02-25 VITALS — BP 117/81 | HR 95 | Temp 98.8°F | Resp 16

## 2024-02-25 DIAGNOSIS — J209 Acute bronchitis, unspecified: Secondary | ICD-10-CM

## 2024-02-25 DIAGNOSIS — R062 Wheezing: Secondary | ICD-10-CM | POA: Diagnosis not present

## 2024-02-25 DIAGNOSIS — R051 Acute cough: Secondary | ICD-10-CM

## 2024-02-25 DIAGNOSIS — R0602 Shortness of breath: Secondary | ICD-10-CM | POA: Diagnosis not present

## 2024-02-25 MED ORDER — PROMETHAZINE-DM 6.25-15 MG/5ML PO SYRP: 5.0000 mL | ORAL_SOLUTION | Freq: Four times a day (QID) | ORAL | 0 refills | Status: DC | PRN

## 2024-02-25 MED ORDER — IPRATROPIUM-ALBUTEROL 0.5-2.5 (3) MG/3ML IN SOLN
3.0000 mL | Freq: Once | RESPIRATORY_TRACT | Status: AC
  Administered 2024-02-25: 3 mL via RESPIRATORY_TRACT

## 2024-02-25 MED ORDER — ALBUTEROL SULFATE HFA 108 (90 BASE) MCG/ACT IN AERS: 2.0000 | INHALATION_SPRAY | RESPIRATORY_TRACT | 0 refills | Status: AC | PRN

## 2024-02-25 MED ORDER — DEXAMETHASONE SODIUM PHOSPHATE 10 MG/ML IJ SOLN
10.0000 mg | Freq: Once | INTRAMUSCULAR | Status: AC
  Administered 2024-02-25: 10 mg via INTRAMUSCULAR

## 2024-02-25 NOTE — ED Triage Notes (Signed)
 Pt reports congestion, cough, headache, chest abd back pain from the cough x 5 days.

## 2024-02-25 NOTE — Discharge Instructions (Signed)
 We have given you a steroid shot today and I have sent in a prescription for albuterol inhaler and cough syrup.  If anything comes back abnormal on your chest x-ray we will give you a call.

## 2024-02-25 NOTE — ED Provider Notes (Signed)
 [facility]    CSN: [id number] Arrival date & time: 02/25/24  1445      History   Chief Complaint Chief Complaint  Patient presents with   Cough    Am currently taking tamiflu since Sunday as prescribed. Extreme pain with cough and chills continue - Entered by patient    HPI Scott Villarreal is a 58 y.o. male.   Patient presenting today with 5-day history of congestion, cough, headache, chest tightness and soreness, wheezing, shortness of breath.  Denies fever, chest pain, abdominal pain, vomiting, diarrhea.  So far not tried anything over-the-counter for symptoms but taking Tessalon and Tamiflu given an e-visit several days ago for suspected influenza.  No known history of chronic pulmonary disease.    Past Medical History:  Diagnosis Date   Erectile dysfunction    Hypercholesterolemia    Hypertension     Patient Active Problem List   Diagnosis Date Noted   Gastroenteritis 02/25/2022   Colon cancer screening 08/23/2020   Preventative health care 09/23/2019   Essential hypertension, benign 05/07/2013   Hyperlipidemia 05/07/2013    Past Surgical History:  Procedure Laterality Date   ELBOW SURGERY Left        Home Medications    Prior to Admission medications   Medication Sig Start Date End Date Taking? Authorizing Provider  albuterol (VENTOLIN HFA) 108 (90 Base) MCG/ACT inhaler Inhale 2 puffs into the lungs every 4 (four) hours as needed. 02/25/24  Yes Particia Nearing, PA-C  promethazine-dextromethorphan (PROMETHAZINE-DM) 6.25-15 MG/5ML syrup Take 5 mLs by mouth 4 (four) times daily as needed. 02/25/24  Yes Particia Nearing, PA-C  acetaminophen (TYLENOL) 500 MG tablet Take 1,000 mg by mouth every 6 (six) hours as needed for mild pain.    [provider]  atorvastatin (LIPITOR) 40 MG tablet Take 1 tablet (40 mg total) by mouth daily. 11/24/23   Babs Sciara, MD  benzonatate (TESSALON) 100 MG capsule Take 1 capsule (100 mg total) by  mouth 3 (three) times daily as needed. 02/22/24   Ward, Tylene Fantasia, PA-C  diclofenac (VOLTAREN) 75 MG EC tablet Take 1 tablet (75 mg total) by mouth 2 (two) times daily. 01/07/23   Babs Sciara, MD  lisinopril (ZESTRIL) 20 MG tablet TAKE ONE (1) TABLET BY MOUTH EVERY DAY 11/24/23   Babs Sciara, MD  meloxicam (MOBIC) 15 MG tablet Take 15 mg by mouth daily. 07/25/23   [provider]  oseltamivir (TAMIFLU) 75 MG capsule Take 1 capsule (75 mg total) by mouth 2 (two) times daily for 5 days. 02/22/24 02/27/24  Ward, Tylene Fantasia, PA-C  polycarbophil (FIBERCON) 625 MG tablet Take 625 mg by mouth as needed for mild constipation.    [provider]  sertraline (ZOLOFT) 50 MG tablet Take 1 tablet (50 mg total) by mouth daily. 12/25/23   Babs Sciara, MD  sildenafil (VIAGRA) 100 MG tablet Take 0.5-1 tablets (50-100 mg total) by mouth daily as needed for erectile dysfunction. 03/14/22   Babs Sciara, MD  traMADol (ULTRAM) 50 MG tablet Take 50-100 mg by mouth 3 (three) times daily as needed. 07/25/23   [provider]    Family History Family History  Problem Relation Age of Onset   Colon cancer Paternal Aunt    Hypertension Other    Colon polyps Neg Hx     Social History Social History   Tobacco Use   Smoking status: Former    Current packs/day: 0.00  Average packs/day: 2.0 packs/day for 25.0 years (50.0 ttl pk-yrs)    Types: Cigarettes    Start date: 08/17/1973    Quit date: 08/17/1998    Years since quitting: 25.5   Smokeless tobacco: Never  Substance Use Topics   Alcohol use: Yes    Alcohol/week: 14.0 standard drinks of alcohol    Types: 14 Cans of beer per week    Comment: 1 or 2 beers a day after work   Drug use: No     Allergies   Patient has no known allergies.   Review of Systems Review of Systems Per HPI  Physical Exam Triage Vital Signs ED Triage Vitals  Encounter Vitals Group     BP 02/25/24 1508 117/81     Systolic BP Percentile --       Diastolic BP Percentile --      Pulse Rate 02/25/24 1508 95     Resp 02/25/24 1508 16     Temp 02/25/24 1508 98.8 F (37.1 C)     Temp Source 02/25/24 1508 Oral     SpO2 02/25/24 1508 96 %     Weight --      Height --      Head Circumference --      Peak Flow --      Pain Score 02/25/24 1511 0     Pain Loc --      Pain Education --      Exclude from Growth Chart --    No data found.  Updated Vital Signs BP 117/81 (BP Location: Right Arm)   Pulse 95   Temp 98.8 F (37.1 C) (Oral)   Resp 16   SpO2 96%   Visual Acuity Right Eye Distance:   Left Eye Distance:   Bilateral Distance:    Right Eye Near:   Left Eye Near:    Bilateral Near:     Physical Exam Vitals and nursing note reviewed.  Constitutional:      Appearance: He is well-developed.  HENT:     Head: Atraumatic.     Right Ear: External ear normal.     Left Ear: External ear normal.     Nose: Rhinorrhea present.     Mouth/Throat:     Pharynx: Posterior oropharyngeal erythema present. No oropharyngeal exudate.  Eyes:     Conjunctiva/sclera: Conjunctivae normal.     Pupils: Pupils are equal, round, and reactive to light.  Cardiovascular:     Rate and Rhythm: Normal rate and regular rhythm.  Pulmonary:     Effort: Pulmonary effort is normal. No respiratory distress.     Breath sounds: Wheezing present. No rales.  Musculoskeletal:        General: Normal range of motion.     Cervical back: Normal range of motion and neck supple.  Lymphadenopathy:     Cervical: No cervical adenopathy.  Skin:    General: Skin is warm and dry.  Neurological:     Mental Status: He is alert and oriented to person, place, and time.  Psychiatric:        Behavior: Behavior normal.      UC Treatments / Results  Labs (all labs ordered are listed, but only abnormal results are displayed) Labs Reviewed - No data to display  EKG   Radiology DG Chest 2 View Result Date: 02/25/2024 CLINICAL DATA:  Productive cough short  of breath and wheezing EXAM: CHEST - 2 VIEW COMPARISON:  CT 07/15/2023 FINDINGS: The heart size and  mediastinal contours are within normal limits. Both lungs are clear. Compression deformity of T12 is chronic. IMPRESSION: No active cardiopulmonary disease. Electronically Signed   By: Jasmine Pang M.D.   On: 02/25/2024 16:49    Procedures Procedures (including critical care time)  Medications Ordered in UC Medications  ipratropium-albuterol (DUONEB) 0.5-2.5 (3) MG/3ML nebulizer solution 3 mL (3 mLs Nebulization Given 02/25/24 1542)  dexamethasone (DECADRON) injection 10 mg (10 mg Intramuscular Given 02/25/24 1607)    Initial Impression / Assessment and Plan / UC Course  I have reviewed the triage vital signs and the nursing notes.  Pertinent labs & imaging results that were available during my care of the patient were reviewed by me and considered in my medical decision making (see chart for details).     Overall vital signs reassuring, significant wheezes initially on exam but almost completely dissipated after DuoNeb treatment with sniffing and symptomatic benefit per patient.  Chest x-ray showing no evidence of pneumonia, suspect a viral bronchitis.  Treat with IM Decadron, albuterol as needed, Phenergan DM.  Supportive over-the-counter medications, home care and return precautions reviewed.  Final Clinical Impressions(s) / UC Diagnoses   Final diagnoses:  Acute cough  Acute bronchitis, unspecified organism  Wheezing     Discharge Instructions      We have given you a steroid shot today and I have sent in a prescription for albuterol inhaler and cough syrup.  If anything comes back abnormal on your chest x-ray we will give you a call.    ED Prescriptions     Medication Sig Dispense Auth. Provider   albuterol (VENTOLIN HFA) 108 (90 Base) MCG/ACT inhaler Inhale 2 puffs into the lungs every 4 (four) hours as needed. 18 g Roosvelt Maser [place], [place]    promethazine-dextromethorphan (PROMETHAZINE-DM) 6.25-15 MG/5ML syrup Take 5 mLs by mouth 4 (four) times daily as needed. 100 mL Particia Nearing, [place]      PDMP not reviewed this encounter.   Particia Nearing, [place] 02/25/24 1654

## 2024-03-03 ENCOUNTER — Encounter: Payer: Self-pay | Admitting: Family Medicine

## 2024-03-03 ENCOUNTER — Ambulatory Visit: Admitting: Family Medicine

## 2024-03-03 VITALS — BP 130/78 | HR 92 | Temp 97.2°F | Ht 68.5 in | Wt 182.0 lb

## 2024-03-03 DIAGNOSIS — J019 Acute sinusitis, unspecified: Secondary | ICD-10-CM

## 2024-03-03 MED ORDER — AMOXICILLIN-POT CLAVULANATE 875-125 MG PO TABS: 1.0000 | ORAL_TABLET | Freq: Two times a day (BID) | ORAL | 0 refills | Status: DC

## 2024-03-03 MED ORDER — PREDNISONE 20 MG PO TABS: ORAL_TABLET | ORAL | 0 refills | Status: DC

## 2024-03-03 NOTE — Progress Notes (Signed)
   Subjective:    Patient ID: Scott Villarreal, male    DOB: 07/13/1966, 58 y.o.   MRN: [id number]  HPI C/o Persistent productive cough, frequent and causing soreness in back . Went to UC last week and received Xray and steroid injection , pt states not improved- recently tried mucinex  Discussed the use of AI scribe software for clinical note transcription with the patient, who gave verbal consent to proceed.  History of Present Illness   Scott Villarreal is a 58 year old male who presents with persistent cough following a flu-like illness.   He has experienced a persistent and severe cough since last Friday, which began with a flu-like illness. The cough is accompanied by clear phlegm and has caused discomfort in his back and stomach due to the intensity of the coughing fits. Despite using an inhaler and Mucinex, there has been no improvement. He received a steroid shot in the hip but has not been on oral prednisone.  He experiences shortness of breath primarily when breathing in, but not while coughing. No significant nasal congestion or head symptoms are present, and he describes the issue as primarily in his windpipe, causing a sensation of 'drowning' when lying on his back due to phlegm accumulation.  He is currently taking a medication in pill form to help with the cough, but reports minimal relief. He was informed that the medication was started too late to be effective.  The cough has prevented him from participating in therapy sessions, as physical activity exacerbates his symptoms.  He quit smoking approximately fifteen years ago and recalls experiencing similar respiratory issues when he smoked.      Review of Systems     Objective:   Physical Exam  General-in no acute distress Eyes-no discharge Lungs-respiratory rate normal, CTA CV-no murmurs,RRR Extremities skin warm dry no edema Neuro grossly normal Behavior normal, alert Cough is noted      Assessment & Plan:  More than  likely viral process Possibility of secondary infection because of persistent symptoms sinus symptoms etc. Augmentin twice daily for the next 10 days warning signs discussed prednisone for the bronchitis no x-ray indicated currently but if not doing better by next week x-ray lab work

## 2024-04-28 ENCOUNTER — Ambulatory Visit: Payer: BC Managed Care – PPO | Admitting: Family Medicine

## 2024-05-06 ENCOUNTER — Encounter: Payer: Self-pay | Admitting: Family Medicine

## 2024-05-06 DIAGNOSIS — F09 Unspecified mental disorder due to known physiological condition: Secondary | ICD-10-CM | POA: Insufficient documentation

## 2024-05-06 DIAGNOSIS — R413 Other amnesia: Secondary | ICD-10-CM | POA: Insufficient documentation

## 2024-05-25 ENCOUNTER — Encounter (INDEPENDENT_AMBULATORY_CARE_PROVIDER_SITE_OTHER): Payer: Self-pay | Admitting: *Deleted

## 2024-06-09 ENCOUNTER — Ambulatory Visit: Admission: EM | Admit: 2024-06-09 | Discharge: 2024-06-09 | Discharge status: Against Medical Advice

## 2024-06-09 ENCOUNTER — Encounter (HOSPITAL_COMMUNITY): Payer: Self-pay | Admitting: *Deleted

## 2024-06-09 ENCOUNTER — Other Ambulatory Visit: Payer: Self-pay

## 2024-06-09 ENCOUNTER — Emergency Department (HOSPITAL_COMMUNITY)

## 2024-06-09 ENCOUNTER — Encounter: Payer: Self-pay | Admitting: Emergency Medicine

## 2024-06-09 ENCOUNTER — Emergency Department (HOSPITAL_COMMUNITY)
Admission: EM | Admit: 2024-06-09 | Discharge: 2024-06-09 | Disposition: A | Discharge status: Home / Self Care | Source: Ambulatory Visit | Attending: Emergency Medicine | Admitting: Emergency Medicine

## 2024-06-09 ENCOUNTER — Ambulatory Visit: Payer: Self-pay

## 2024-06-09 DIAGNOSIS — Z79899 Other long term (current) drug therapy: Secondary | ICD-10-CM | POA: Insufficient documentation

## 2024-06-09 DIAGNOSIS — I10 Essential (primary) hypertension: Secondary | ICD-10-CM | POA: Insufficient documentation

## 2024-06-09 DIAGNOSIS — R519 Headache, unspecified: Secondary | ICD-10-CM | POA: Insufficient documentation

## 2024-06-09 DIAGNOSIS — H538 Other visual disturbances: Secondary | ICD-10-CM | POA: Diagnosis not present

## 2024-06-09 MED ORDER — KETOROLAC TROMETHAMINE 15 MG/ML IJ SOLN
15.0000 mg | Freq: Once | INTRAMUSCULAR | Status: AC
  Administered 2024-06-09: 15 mg via INTRAVENOUS
  Filled 2024-06-09: qty 1

## 2024-06-09 MED ORDER — DIPHENHYDRAMINE HCL 50 MG/ML IJ SOLN
25.0000 mg | Freq: Once | INTRAMUSCULAR | Status: AC
  Administered 2024-06-09: 25 mg via INTRAVENOUS
  Filled 2024-06-09: qty 1

## 2024-06-09 MED ORDER — METOCLOPRAMIDE HCL 5 MG/ML IJ SOLN
10.0000 mg | Freq: Once | INTRAMUSCULAR | Status: AC
  Administered 2024-06-09: 10 mg via INTRAVENOUS
  Filled 2024-06-09: qty 2

## 2024-06-09 NOTE — ED Notes (Signed)
 Discussed pt presentation with provider. Provider reported pt will likely need higher level of care for imaging, etc. Discussed provider recommendations with pt and pt spouse. Pt elected to go to ED prior to provider assessment.

## 2024-06-09 NOTE — ED Triage Notes (Signed)
 Pt states yesterday he noticed white squiggly lines in his right eye yesterday with a headache; pt states he laid down took a nap and the pain and vision changes went away  Pt states he got up this am and went to work when he started having same symptoms with nausea today

## 2024-06-09 NOTE — Telephone Encounter (Signed)
 FYI Only or Action Required?: FYI only for provider.  Patient was last seen in primary care on 03/03/2024 by Alphonsa Glendia LABOR, MD.  Called Nurse Triage reporting Eye Strain.  Symptoms began yesterday.  Interventions attempted: Nothing.  Symptoms are: gradually worsening.  Triage Disposition: Go to ED or PCP/Alternative with Approval  Patient/caregiver understands and will follow disposition?: Yes, will follow disposition  Copied from CRM [id number]. Topic: Clinical - Red Word Triage >> Jun 09, 2024  8:02 AM Charlet HERO wrote: Red Word that prompted transfer to Nurse Triage: Patient is calling about seeing squigly lines in his right eye, he also feels pressure in his head. Reason for Disposition  Patient sounds very sick or weak to the triager  Answer Assessment - Initial Assessment Questions 1. ONSET: When did the pain start? (e.g., minutes, hours, days)     Yesterday then resolved, started again this morning 2. TIMING: Does the pain come and go, or has it been constant since it started? (e.g., constant, intermittent, fleeting)     intermittent 3. SEVERITY: How bad is the pain?   (Scale 1-10; mild, moderate or severe)   - MILD (1-3): doesn't interfere with normal activities    - MODERATE (4-7): interferes with normal activities or awakens from sleep    - SEVERE (8-10): excruciating pain and patient unable to do normal activities     HA-sore, pressure on bilateral temples 4. LOCATION: Where does it hurt?  (e.g., eyelid, eye, cheekbone)     temples 5. CAUSE: What do you think is causing the pain?     Unsure,  6. VISION: Do you have blurred vision or changes in your vision?      Squiggly lines in vision 7. EYE DISCHARGE: Is there any discharge (pus) from the eye(s)?  If Yes, ask: What color is it?      denies 8. FEVER: Do you have a fever? If Yes, ask: What is it, how was it measured, and when did it start?      denies 9. OTHER SYMPTOMS: Do you have any other  symptoms? (e.g., headache, nasal discharge, facial rash)     HA, dizziness, nausea  Protocols used: Eye Pain and Other Symptoms-A-AH

## 2024-06-09 NOTE — ED Triage Notes (Addendum)
 Pt reports right eye has wavy lines in line of site, headache since last night. Reports symptoms improved last night but returned this morning. Denies any known injury. Denies any numbness or tingling in extremity. Pt alert and oriented. Gait steady. Speech clear.

## 2024-06-09 NOTE — Discharge Instructions (Signed)
 You are seen today for headache with intermittent squiggly lines in your right eye prior to headache.  Your CT scan was normal, I feel your symptoms are likely related to a migraine.  Your headache has improved with migraine medicines, though it is slightly unusual to have onset at this point in your life of headache and you should follow-up with your PCP and neurology.  If you develop any new or worsening symptoms come back to the ER.

## 2024-06-09 NOTE — ED Provider Notes (Addendum)
 [place] EM[facility] DEPARTMENT AT [facility] Provider Note   CSN: [id number] Arrival date & time: 06/09/24  9074     Patient presents with: Visual Field Change   Scott Villarreal is a 58 y.o. male.  He has PMH of hypertension, high cholesterol.  Presents the ER for evaluation of headache.  Started 2 days ago, he noticed some squiggly lines in the temporal visual field of the right eye lasted for few hours that he got a headache and nausea, eventually this resolved, tap again yesterday and again this morning.  The vision changes always resolve once the headache comes.  The headache is described as throbbing, bilateral temples.  He has no temporal artery tenderness, no jaw claudication, has not had any vision loss.  He states once he develops a headache the squiggly lines in his vision goes away.  His vision is normal at this time.   HPI     Prior to Admission medications   Medication Sig Start Date End Date Taking? Authorizing Provider  acetaminophen  (TYLENOL ) 500 MG tablet Take 1,000 mg by mouth every 6 (six) hours as needed for mild pain.    [provider]  albuterol  (VENTOLIN  HFA) 108 (90 Base) MCG/ACT inhaler Inhale 2 puffs into the lungs every 4 (four) hours as needed. 02/25/24   Stuart Vernell Norris, PA-C  amoxicillin -clavulanate (AUGMENTIN ) 875-125 MG tablet Take 1 tablet by mouth 2 (two) times daily. 03/03/24   Alphonsa Glendia LABOR, MD  atorvastatin  (LIPITOR) 40 MG tablet Take 1 tablet (40 mg total) by mouth daily. 11/24/23   Alphonsa Glendia LABOR, MD  diclofenac  (VOLTAREN ) 75 MG EC tablet Take 1 tablet (75 mg total) by mouth 2 (two) times daily. 01/07/23   Alphonsa Glendia LABOR, MD  lisinopril  (ZESTRIL ) 20 MG tablet TAKE ONE (1) TABLET BY MOUTH EVERY DAY 11/24/23   Alphonsa Glendia LABOR, MD  meloxicam  (MOBIC ) 15 MG tablet Take 15 mg by mouth daily. 07/25/23   [provider]  polycarbophil (FIBERCON) 625 MG tablet Take 625 mg by mouth as needed for mild constipation.    [provider]  predniSONE  (DELTASONE ) 20 MG tablet 2 every day for 5 days 03/03/24   Alphonsa Glendia LABOR, MD  promethazine -dextromethorphan (PROMETHAZINE -DM) 6.25-15 MG/5ML syrup Take 5 mLs by mouth 4 (four) times daily as needed. 02/25/24   Stuart Vernell Norris, PA-C  sertraline  (ZOLOFT ) 50 MG tablet Take 1 tablet (50 mg total) by mouth daily. 12/25/23   Alphonsa Glendia LABOR, MD  sildenafil  (VIAGRA ) 100 MG tablet Take 0.5-1 tablets (50-100 mg total) by mouth daily as needed for erectile dysfunction. 03/14/22   Alphonsa Glendia LABOR, MD  traMADol  (ULTRAM ) 50 MG tablet Take 50-100 mg by mouth 3 (three) times daily as needed. 07/25/23   [provider]    Allergies: Patient has no known allergies.    Review of Systems  Updated Vital Signs BP 113/74 (BP Location: Right Arm)   Pulse 88   Temp 98.1 F (36.7 C) (Oral)   Resp 16   Ht 5' 9 (1.753 m)   Wt 84.8 kg   SpO2 99%   BMI 27.62 kg/m   Physical Exam Vitals and nursing note reviewed.  Constitutional:      General: He is not in acute distress.    Appearance: He is well-developed.  HENT:     Head: Normocephalic and atraumatic.  Eyes:     General: Lids are normal.     Extraocular Movements: Extraocular movements intact.  Right eye: Normal extraocular motion and no nystagmus.     Left eye: Normal extraocular motion and no nystagmus.     Conjunctiva/sclera: Conjunctivae normal.     Right eye: Right conjunctiva is not injected. No chemosis, exudate or hemorrhage.    Left eye: Left conjunctiva is not injected. No chemosis, exudate or hemorrhage.    Pupils: Pupils are equal, round, and reactive to light.     Funduscopic exam:    Right eye: No hemorrhage, exudate, AV nicking or papilledema. Red reflex and venous pulsations present.        Left eye: No hemorrhage, exudate, AV nicking or papilledema. Red reflex and venous pulsations present. Cardiovascular:     Rate and Rhythm: Normal rate and regular rhythm.     Heart sounds: No murmur  heard. Pulmonary:     Effort: Pulmonary effort is normal. No respiratory distress.     Breath sounds: Normal breath sounds.  Abdominal:     Palpations: Abdomen is soft.     Tenderness: There is no abdominal tenderness.  Musculoskeletal:        General: No swelling.     Cervical back: Neck supple.  Skin:    General: Skin is warm and dry.     Capillary Refill: Capillary refill takes less than 2 seconds.  Neurological:     General: No focal deficit present.     Mental Status: He is alert and oriented to person, place, and time.     GCS: GCS eye subscore is 4. GCS verbal subscore is 5. GCS motor subscore is 6.     Cranial Nerves: Cranial nerves 2-12 are intact.     Sensory: Sensation is intact.     Motor: No weakness or tremor.     Coordination: Romberg sign negative. Finger-Nose-Finger Test normal.     Gait: Gait normal.  Psychiatric:        Mood and Affect: Mood normal.     (all labs ordered are listed, but only abnormal results are displayed) Labs Reviewed - No data to display  EKG: None  Radiology: No results found.   Procedures   Medications Ordered in the ED - No data to display                                  Medical Decision Making This patient presents to the ED for concern of 3 days of intermittent headaches with intermittent right vision change prior to headache onset, this involves an extensive number of treatment options, and is a complaint that carries with it a high risk of complications and morbidity.  The differential diagnosis includes pain, tension headache, temporal arteritis, vitreous hemorrhage, vitreous detachment, retinal detachment,,, other   Co morbidities that complicate the patient evaluation Hypertension, high cholesterol   Additional history obtained:  Additional history obtained from EMR External records from outside source obtained and reviewed including prior notes, prior labs, MRI from earlier this year    Imaging Studies  ordered:  I ordered imaging studies including CT head I independently visualized and interpreted imaging which showed no sign of stroke or hemorrhage or mass I agree with the radiologist interpretation     Problem List / ED Course / Critical interventions / Medication management  Headache with right eye vision changes-describing right eye squiggly vision only in the periphery of his vision that precedes headaches x 3 days, it resolves once he gets a headache  and nausea.  He is not having a vision changes in the ED.  Is a completely normal neurologic exam and a normal CT of his head.  He has no temporal artery tenderness, no vision loss, no jaw claudication to suggest temporal arteritis, discussed with patient he is describing was likely a migraine with visual aura.  Given that he is 23 is never had history of migraines in the past this would be slightly unusual so I recommended he follow-up with neurology and his PCP.  His symptoms are not consistent with any emergencies such as retinal or vitreous detachment.  He has no ocular pain, redness has normal eye exam, no suspect glaucoma.  His headache resolved with migraine cocktail he is standing the hallway asking to be discharged at this time.  He was given strict return precautions.  I also discussed case with Dr. Zammit while patient was in the emergency department. I ordered medication including toradol , reglan , benadryl   for headache  Reevaluation of the patient after these medicines showed that the patient improved I have reviewed the patients home medicines and have made adjustments as needed      Amount and/or Complexity of Data Reviewed Radiology: ordered.  Risk Prescription drug management.        Final diagnoses:  None    ED Discharge Orders     None          Suellen Sherran DELENA DEVONNA 06/09/24 [address] 06/09/24 1906    Suzette Pac, MD 06/12/24 [phone]

## 2024-06-11 ENCOUNTER — Encounter: Payer: Self-pay | Admitting: Neurology

## 2024-07-21 ENCOUNTER — Ambulatory Visit: Payer: Self-pay

## 2024-08-06 ENCOUNTER — Encounter: Payer: Self-pay | Admitting: Family Medicine

## 2024-08-06 ENCOUNTER — Ambulatory Visit: Payer: Self-pay | Admitting: Family Medicine

## 2024-08-06 ENCOUNTER — Ambulatory Visit: Admitting: Family Medicine

## 2024-08-06 VITALS — BP 133/87 | HR 97 | Temp 98.4°F | Ht 69.0 in | Wt 188.0 lb

## 2024-08-06 DIAGNOSIS — E782 Mixed hyperlipidemia: Secondary | ICD-10-CM

## 2024-08-06 DIAGNOSIS — R7303 Prediabetes: Secondary | ICD-10-CM

## 2024-08-06 DIAGNOSIS — R319 Hematuria, unspecified: Secondary | ICD-10-CM | POA: Diagnosis not present

## 2024-08-06 DIAGNOSIS — I10 Essential (primary) hypertension: Secondary | ICD-10-CM | POA: Diagnosis not present

## 2024-08-06 LAB — POCT URINALYSIS DIP (CLINITEK)
Bilirubin, UA: NEGATIVE
Blood, UA: NEGATIVE
Glucose, UA: NEGATIVE mg/dL
Leukocytes, UA: NEGATIVE
Nitrite, UA: NEGATIVE
POC PROTEIN,UA: NEGATIVE
Spec Grav, UA: 1.015 (ref 1.010–1.025)
Urobilinogen, UA: 0.2 U/dL
pH, UA: 6 (ref 5.0–8.0)

## 2024-08-06 MED ORDER — LISINOPRIL 20 MG PO TABS: ORAL_TABLET | ORAL | 1 refills | Status: AC

## 2024-08-06 MED ORDER — ATORVASTATIN CALCIUM 40 MG PO TABS: 40.0000 mg | ORAL_TABLET | Freq: Every day | ORAL | 1 refills | Status: AC

## 2024-08-06 NOTE — Progress Notes (Signed)
 Subjective:    Patient ID: Scott Villarreal, male    DOB: 08/03/1966, 58 y.o.   MRN: [id number]  HPI Follow up chronic medical concerns Loose stools 3 days  Needs labs [name] is a 58 year old male who presents with back pain and recent hematuria.  He has been experiencing significant back pain following a back injury, which has affected his quality of life and resulted in work restrictions. 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 Currently, he works behind Scientist, product/process development, engages in some telethons, and Charity fundraiser tasks. Despite working eight-hour days, five days a week, he has missed several days due to his condition.  He mentions a recent episode of hematuria about a month ago, noticing blood in the toilet after urination. Initially, he associated this with peanut consumption, but after stopping peanuts, the hematuria resolved. He denies seeing red blood during urination but observed blood in the toilet bowl and on shaking his hand after urination.  He drinks one or two beers at night. His wife Jon notes that she has to repeat herself to him in the evenings. He has attempted to reduce his alcohol intake, going several days without drinking, but finds it challenging after work. He has not engaged in much physical activity.  No family history of colon cancer, although he mentions an aunt who had the condition. He has not had a colonoscopy due to logistical issues with scheduling the procedure.  He reports good balance and bowel movements, though he had an upset stomach recently. Normal urinary flow. No memory issues such as forgetting directions or tasks, except for occasional forgetfulness after drinking. No changes in memory or cognitive function. Past Medical History Copy/Paste  Review of Systems     Objective:   Physical Exam  General-in no acute distress Eyes-no discharge Lungs-respiratory rate normal, CTA CV-no  murmurs,RRR Extremities skin warm dry no edema Neuro grossly normal Behavior normal, alert       Assessment & Plan:   Hematuria Intermittent hematuria noted one month ago. No current hematuria. Differential includes renal or bladder pathology. Further evaluation required. - Order blood work for A1c, kidney function, and cholesterol. - Order CT scan of kidneys and bladder within 7-10 days. - Consider urology referral for cystoscopy if CT scan inconclusive.  Low back pain due to back injury Chronic low back pain from previous injury. Managed with work restrictions. Pain affects quality of life. - Continue work restrictions with ten-pound lifting limit. - Encourage increased physical activity, specifically walking.  Alcohol use Ongoing alcohol use, one to two beers nightly. Discussed cognitive effects and encouraged reduction. - Encourage reduction in alcohol consumption. - Consider trial of non-alcoholic beer as alternative.  1. Mixed hyperlipidemia (Primary) Check lab work Patient at risk of heart disease I would encourage aggressive treatment depending on the results  - Lipid Panel - Lipoprotein A (LPA)  2. Essential hypertension, benign Blood pressure decent control continue current measures patient encouraged to minimize alcohol intake - Basic Metabolic Panel  3. Prediabetes Minimize alcohol intake stay healthy with eating choices stay physically active - Hemoglobin A1c  4. Hematuria, unspecified type Will progress forward with doing CT scan More than likely will also need urology consult and cystoscopy  Should be noted that he had small amount of blood when he urinated he saw it in the commode and then when he shook his penis he also noted that bright red blood came from the  penis - POCT URINALYSIS DIP (CLINITEK)  CT scan also requested staff will be processed because of hematuria

## 2024-08-08 LAB — BASIC METABOLIC PANEL WITH GFR
BUN/Creatinine Ratio: 13 (ref 9–20)
BUN: 17 mg/dL (ref 6–24)
CO2: 23 mmol/L (ref 20–29)
Calcium: 9.3 mg/dL (ref 8.7–10.2)
Chloride: 100 mmol/L (ref 96–106)
Creatinine, Ser: 1.33 mg/dL — ABNORMAL HIGH (ref 0.76–1.27)
Glucose: 84 mg/dL (ref 70–99)
Potassium: 4.4 mmol/L (ref 3.5–5.2)
Sodium: 138 mmol/L (ref 134–144)
eGFR: 62 mL/min/1.73 (ref >59)

## 2024-08-08 LAB — LIPOPROTEIN A (LPA): Lipoprotein (a): 10.6 nmol/L (ref <75.0)

## 2024-08-08 LAB — LIPID PANEL
Chol/HDL Ratio: 6.3 ratio — ABNORMAL HIGH (ref 0.0–5.0)
Cholesterol, Total: 279 mg/dL — ABNORMAL HIGH (ref 100–199)
HDL: 44 mg/dL (ref >39)
LDL Chol Calc (NIH): 141 mg/dL — ABNORMAL HIGH (ref 0–99)
Triglycerides: 507 mg/dL — ABNORMAL HIGH (ref 0–149)
VLDL Cholesterol Cal: 94 mg/dL — ABNORMAL HIGH (ref 5–40)

## 2024-08-08 LAB — HEMOGLOBIN A1C
Est. average glucose Bld gHb Est-mCnc: 123 mg/dL
Hgb A1c MFr Bld: 5.9 % — ABNORMAL HIGH (ref 4.8–5.6)

## 2024-08-12 ENCOUNTER — Other Ambulatory Visit: Payer: Self-pay

## 2024-08-12 DIAGNOSIS — R319 Hematuria, unspecified: Secondary | ICD-10-CM

## 2024-09-28 ENCOUNTER — Ambulatory Visit (HOSPITAL_COMMUNITY): Admission: RE | Admit: 2024-09-28 | Source: Ambulatory Visit

## 2024-10-21 ENCOUNTER — Ambulatory Visit: Admitting: Neurology

## 2024-10-25 ENCOUNTER — Ambulatory Visit (HOSPITAL_COMMUNITY)
Admission: RE | Admit: 2024-10-25 | Discharge: 2024-10-25 | Disposition: A | Discharge status: Home / Self Care | Source: Ambulatory Visit | Attending: Family Medicine | Admitting: Family Medicine

## 2024-10-25 DIAGNOSIS — I10 Essential (primary) hypertension: Secondary | ICD-10-CM | POA: Diagnosis not present

## 2024-10-25 DIAGNOSIS — R319 Hematuria, unspecified: Secondary | ICD-10-CM | POA: Diagnosis not present

## 2024-10-25 DIAGNOSIS — K4020 Bilateral inguinal hernia, without obstruction or gangrene, not specified as recurrent: Secondary | ICD-10-CM | POA: Diagnosis not present

## 2024-10-25 DIAGNOSIS — K5730 Diverticulosis of large intestine without perforation or abscess without bleeding: Secondary | ICD-10-CM | POA: Diagnosis not present

## 2024-10-25 DIAGNOSIS — K8020 Calculus of gallbladder without cholecystitis without obstruction: Secondary | ICD-10-CM | POA: Diagnosis not present

## 2024-10-25 LAB — POCT I-STAT CREATININE: Creatinine, Ser: 1.2 mg/dL (ref 0.61–1.24)

## 2024-10-25 MED ORDER — IOHEXOL 300 MG/ML  SOLN
125.0000 mL | Freq: Once | INTRAMUSCULAR | Status: AC | PRN
  Administered 2024-10-25: 125 mL via INTRAVENOUS

## 2024-10-31 ENCOUNTER — Ambulatory Visit: Payer: Self-pay | Admitting: Family Medicine

## 2024-11-03 ENCOUNTER — Other Ambulatory Visit: Payer: Self-pay

## 2024-11-03 DIAGNOSIS — K529 Noninfective gastroenteritis and colitis, unspecified: Secondary | ICD-10-CM

## 2024-11-03 DIAGNOSIS — R972 Elevated prostate specific antigen [PSA]: Secondary | ICD-10-CM

## 2024-11-29 ENCOUNTER — Encounter: Payer: Self-pay | Admitting: Internal Medicine

## 2024-12-02 NOTE — Telephone Encounter (Signed)
 Nurse  Please send [facility] gastroenterology consultation to help get the patient set up for colonoscopy for colon cancer screening  Thanks-Dr. Glendia  Please send patient a message letting him know this referral was placed and they should reach out to him within the next 14 days to help set this up

## 2024-12-03 ENCOUNTER — Other Ambulatory Visit: Payer: Self-pay

## 2024-12-03 DIAGNOSIS — Z1211 Encounter for screening for malignant neoplasm of colon: Secondary | ICD-10-CM

## 2024-12-03 DIAGNOSIS — K529 Noninfective gastroenteritis and colitis, unspecified: Secondary | ICD-10-CM

## 2024-12-07 ENCOUNTER — Encounter (INDEPENDENT_AMBULATORY_CARE_PROVIDER_SITE_OTHER): Payer: Self-pay | Admitting: *Deleted

## 2024-12-13 ENCOUNTER — Ambulatory Visit
Admission: RE | Admit: 2024-12-13 | Discharge: 2024-12-13 | Disposition: A | Discharge status: Home / Self Care | Payer: Self-pay | Source: Ambulatory Visit | Attending: Nurse Practitioner | Admitting: Nurse Practitioner

## 2024-12-13 VITALS — BP 123/98 | HR 96 | Temp 98.2°F | Resp 20

## 2024-12-13 DIAGNOSIS — R591 Generalized enlarged lymph nodes: Secondary | ICD-10-CM

## 2024-12-13 DIAGNOSIS — J029 Acute pharyngitis, unspecified: Secondary | ICD-10-CM

## 2024-12-13 LAB — POCT RAPID STREP A (OFFICE): Rapid Strep A Screen: NEGATIVE

## 2024-12-13 NOTE — ED Triage Notes (Signed)
 Pt reports sore throat, swollen glands, and headache x 3 days. Pain shots through the temple. Nausea started today.

## 2024-12-13 NOTE — Discharge Instructions (Signed)
 It appears you do not have any lymph node swelling on exam today.  As discussed, you may continue over-the-counter pain medications such as Tylenol  or ibuprofen  as directed.  Also recommend warm compresses to the affected area as needed for pain or discomfort.  Monitor the area for worsening to include increased pain or increased swelling in the lymph node area.  If the symptoms return, you may follow-up in this clinic or with your primary care physician for further evaluation.

## 2024-12-13 NOTE — ED Provider Notes (Signed)
 " [facility]    CSN: [id number] Arrival date & time: 12/13/24  9076      History   Chief Complaint Chief Complaint  Patient presents with   Sore Throat    Pain in gland in neck and headache - Entered by patient    HPI Scott Villarreal is a 59 y.o. male.   The history is provided by the patient.   Patient presents for complaints lymph node swelling in the right neck.  Patient states symptoms started 2 to 3 days ago.  He states that the pain radiated into the right side of his head.  He states that symptoms were worse last evening.  States today, he is feeling better.  Denies fever, chills, ear drainage, nasal congestion, runny nose, cough, abdominal pain, nausea, vomiting, diarrhea, or rash.  Patient states that he did have a headache last evening, states that he took Advil  which helped his symptoms.  He denies any obvious close sick contacts.  Past Medical History:  Diagnosis Date   Erectile dysfunction    Hypercholesterolemia    Hypertension     Patient Active Problem List   Diagnosis Date Noted   Poor memory 05/06/2024   Cognitive dysfunction 05/06/2024   Gastroenteritis 02/25/2022   Colon cancer screening 08/23/2020   Preventative health care 09/23/2019   Essential hypertension, benign 05/07/2013   Hyperlipidemia 05/07/2013    Past Surgical History:  Procedure Laterality Date   ELBOW SURGERY Left        Home Medications    Prior to Admission medications  Medication Sig Start Date End Date Taking? Authorizing Provider  acetaminophen  (TYLENOL ) 500 MG tablet Take 1,000 mg by mouth every 6 (six) hours as needed for mild pain.    [provider]  albuterol  (VENTOLIN  HFA) 108 (90 Base) MCG/ACT inhaler Inhale 2 puffs into the lungs every 4 (four) hours as needed. 02/25/24   Stuart Vernell Norris, PA-C  atorvastatin  (LIPITOR) 40 MG tablet Take 1 tablet (40 mg total) by mouth daily. 08/06/24   Alphonsa Glendia LABOR, MD  diclofenac  (VOLTAREN ) 75 MG EC tablet  Take 1 tablet (75 mg total) by mouth 2 (two) times daily. 01/07/23   Alphonsa Glendia LABOR, MD  lisinopril  (ZESTRIL ) 20 MG tablet TAKE ONE (1) TABLET BY MOUTH EVERY DAY 08/06/24   Alphonsa Glendia LABOR, MD  meloxicam  (MOBIC ) 15 MG tablet Take 15 mg by mouth daily. 07/25/23   [provider]  polycarbophil (FIBERCON) 625 MG tablet Take 625 mg by mouth as needed for mild constipation.    [provider]  sertraline  (ZOLOFT ) 50 MG tablet Take 1 tablet (50 mg total) by mouth daily. 12/25/23   Alphonsa Glendia LABOR, MD  sildenafil  (VIAGRA ) 100 MG tablet Take 0.5-1 tablets (50-100 mg total) by mouth daily as needed for erectile dysfunction. 03/14/22   Alphonsa Glendia LABOR, MD  traMADol  (ULTRAM ) 50 MG tablet Take 50-100 mg by mouth 3 (three) times daily as needed. 07/25/23   [provider]    Family History Family History  Problem Relation Age of Onset   Colon cancer Paternal Aunt    Hypertension Other    Colon polyps Neg Hx     Social History Social History[1]   Allergies   Patient has no known allergies.   Review of Systems Review of Systems Per HPI  Physical Exam Triage Vital Signs ED Triage Vitals [12/13/24 0939]  Encounter Vitals Group     BP (!) 123/98     Girls  Systolic BP Percentile      Girls Diastolic BP Percentile      Boys Systolic BP Percentile      Boys Diastolic BP Percentile      Pulse Rate 96     Resp 20     Temp 98.2 F (36.8 C)     Temp Source Oral     SpO2 96 %     Weight      Height      Head Circumference      Peak Flow      Pain Score 3     Pain Loc      Pain Education      Exclude from Growth Chart    No data found.  Updated Vital Signs BP (!) 123/98 (BP Location: Right Arm)   Pulse 96   Temp 98.2 F (36.8 C) (Oral)   Resp 20   SpO2 96%   Visual Acuity Right Eye Distance:   Left Eye Distance:   Bilateral Distance:    Right Eye Near:   Left Eye Near:    Bilateral Near:     Physical Exam Vitals and nursing note reviewed.   Constitutional:      General: He is not in acute distress.    Appearance: He is well-developed.  HENT:     Head: Normocephalic.     Right Ear: Tympanic membrane and ear canal normal.     Left Ear: Tympanic membrane and ear canal normal.     Nose: No congestion.     Mouth/Throat:     Mouth: Mucous membranes are moist.     Pharynx: Uvula midline.  Eyes:     Conjunctiva/sclera: Conjunctivae normal.     Pupils: Pupils are equal, round, and reactive to light.  Cardiovascular:     Rate and Rhythm: Normal rate and regular rhythm.     Heart sounds: Normal heart sounds.  Pulmonary:     Effort: Pulmonary effort is normal.     Breath sounds: Normal breath sounds.  Abdominal:     General: Bowel sounds are normal. There is no distension.     Palpations: Abdomen is soft. There is no mass.     Tenderness: There is no abdominal tenderness. There is no guarding or rebound.  Musculoskeletal:     Cervical back: Normal range of motion and neck supple.  Lymphadenopathy:     Cervical: No cervical adenopathy.  Skin:    General: Skin is warm and dry.  Neurological:     General: No focal deficit present.     Mental Status: He is alert and oriented to person, place, and time.  Psychiatric:        Mood and Affect: Mood normal.        Behavior: Behavior normal.      UC Treatments / Results  Labs (all labs ordered are listed, but only abnormal results are displayed) Labs Reviewed  POCT RAPID STREP A (OFFICE) - Normal  POC SOFIA SARS ANTIGEN FIA    EKG   Radiology No results found.  Procedures Procedures (including critical care time)  Medications Ordered in UC Medications - No data to display  Initial Impression / Assessment and Plan / UC Course  I have reviewed the triage vital signs and the nursing notes.  Pertinent labs & imaging results that were available during my care of the patient were reviewed by me and considered in my medical decision making (see chart for  details).  The  rapid strep test was negative.  A COVID test was ordered, but is not necessary at this time.  On exam, the patient does not have any obvious cervical lymphadenopathy present.  The right tympanic membrane is within normal limits, and patient does not have any dental concerns at this time.  Symptoms are consistent with lymphadenopathy that has improved over the past several hours.  Supportive care recommendations were provided and discussed with the patient to include over-the-counter analgesics, and warm compresses to the affected area.  Patient was given indications regarding follow-up.  Patient was in agreement with this plan of care and verbalizes understanding.  All questions were answered.  Patient stable for discharge.  Final Clinical Impressions(s) / UC Diagnoses   Final diagnoses:  Sore throat  Lymphadenopathy     Discharge Instructions      It appears you do not have any lymph node swelling on exam today.  As discussed, you may continue over-the-counter pain medications such as Tylenol  or ibuprofen  as directed.  Also recommend warm compresses to the affected area as needed for pain or discomfort.  Monitor the area for worsening to include increased pain or increased swelling in the lymph node area.  If the symptoms return, you may follow-up in this clinic or with your primary care physician for further evaluation.     ED Prescriptions   None    PDMP not reviewed this encounter.     [1]  Social History Tobacco Use   Smoking status: Former    Current packs/day: 0.00    Average packs/day: 2.0 packs/day for 25.0 years (50.0 ttl pk-yrs)    Types: Cigarettes    Start date: 08/17/1973    Quit date: 08/17/1998    Years since quitting: 26.3   Smokeless tobacco: Never  Substance Use Topics   Alcohol use: Yes    Alcohol/week: 14.0 standard drinks of alcohol    Types: 14 Cans of beer per week    Comment: 1 or 2 beers a day after work   Drug use: No      Gilmer Etta PARAS, NP 12/13/24 1015  "

## 2025-02-16 ENCOUNTER — Ambulatory Visit: Admitting: Family Medicine

## 2025-03-07 ENCOUNTER — Ambulatory Visit: Admitting: Urology
